# Patient Record
Sex: Male | Born: 1970
Health system: Southern US, Community
[De-identification: ages and names within clinical notes are randomized; demographics above are authoritative.]

## PROBLEM LIST (undated history)

## (undated) DIAGNOSIS — F1910 Other psychoactive substance abuse, uncomplicated: Secondary | ICD-10-CM

## (undated) DIAGNOSIS — C801 Malignant (primary) neoplasm, unspecified: Secondary | ICD-10-CM

## (undated) DIAGNOSIS — I10 Essential (primary) hypertension: Secondary | ICD-10-CM

## (undated) DIAGNOSIS — E669 Obesity, unspecified: Secondary | ICD-10-CM

## (undated) HISTORY — PX: OTHER SURGICAL HISTORY: SHX169

---

## 2009-05-06 ENCOUNTER — Emergency Department (HOSPITAL_COMMUNITY): Admission: EM | Admit: 2009-05-06 | Discharge: 2009-05-06 | Payer: Self-pay | Admitting: Emergency Medicine

## 2009-05-17 ENCOUNTER — Emergency Department (HOSPITAL_COMMUNITY): Admission: EM | Admit: 2009-05-17 | Discharge: 2009-05-17 | Payer: Self-pay | Admitting: Emergency Medicine

## 2010-09-20 ENCOUNTER — Emergency Department (HOSPITAL_COMMUNITY)
Admission: EM | Admit: 2010-09-20 | Discharge: 2010-09-21 | Payer: Self-pay | Source: Home / Self Care | Admitting: Emergency Medicine

## 2011-01-21 ENCOUNTER — Emergency Department (HOSPITAL_COMMUNITY)
Admission: EM | Admit: 2011-01-21 | Discharge: 2011-01-21 | Disposition: A | Discharge status: Home / Self Care | Payer: Self-pay | Attending: Emergency Medicine | Admitting: Emergency Medicine

## 2011-01-21 DIAGNOSIS — L0231 Cutaneous abscess of buttock: Secondary | ICD-10-CM | POA: Insufficient documentation

## 2011-01-21 DIAGNOSIS — K089 Disorder of teeth and supporting structures, unspecified: Secondary | ICD-10-CM | POA: Insufficient documentation

## 2011-01-21 DIAGNOSIS — K029 Dental caries, unspecified: Secondary | ICD-10-CM | POA: Insufficient documentation

## 2011-10-13 ENCOUNTER — Encounter (HOSPITAL_COMMUNITY): Payer: Self-pay | Admitting: *Deleted

## 2011-10-13 ENCOUNTER — Emergency Department (HOSPITAL_COMMUNITY)
Admission: EM | Admit: 2011-10-13 | Discharge: 2011-10-13 | Disposition: A | Discharge status: Home / Self Care | Payer: Self-pay | Attending: Emergency Medicine | Admitting: Emergency Medicine

## 2011-10-13 DIAGNOSIS — F17200 Nicotine dependence, unspecified, uncomplicated: Secondary | ICD-10-CM | POA: Insufficient documentation

## 2011-10-13 DIAGNOSIS — K0889 Other specified disorders of teeth and supporting structures: Secondary | ICD-10-CM

## 2011-10-13 DIAGNOSIS — K089 Disorder of teeth and supporting structures, unspecified: Secondary | ICD-10-CM | POA: Insufficient documentation

## 2011-10-13 DIAGNOSIS — R220 Localized swelling, mass and lump, head: Secondary | ICD-10-CM | POA: Insufficient documentation

## 2011-10-13 MED ORDER — IBUPROFEN 800 MG PO TABS: 800.0000 mg | ORAL_TABLET | Freq: Three times a day (TID) | ORAL | Status: AC

## 2011-10-13 MED ORDER — PENICILLIN V POTASSIUM 500 MG PO TABS: 500.0000 mg | ORAL_TABLET | Freq: Four times a day (QID) | ORAL | Status: AC

## 2011-10-13 MED ORDER — IBUPROFEN 800 MG PO TABS
800.0000 mg | ORAL_TABLET | Freq: Once | ORAL | Status: AC
  Administered 2011-10-13: 800 mg via ORAL
  Filled 2011-10-13: qty 1

## 2011-10-13 NOTE — ED Provider Notes (Signed)
History     CSN: [id number]  Arrival date & time 10/13/11  1224   First MD Initiated Contact with Patient 10/13/11 1241      Chief Complaint  Patient presents with  . Dental Pain    (Consider location/radiation/quality/duration/timing/severity/associated sxs/prior treatment) HPI Comments: Patient reports that he has been having left upper dental pain for several days.  He reports that he has a history of dental abscesses.  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  He denies any abscess at this time. Denies any fever or chills.   Patient is a 41 y.o. male presenting with tooth pain. The history is provided by the patient.  Dental PainPrimary symptoms do not include dental injury, oral bleeding, fever or shortness of breath. The symptoms are worsening.  Additional symptoms include: gum swelling and gum tenderness. Additional symptoms do not include: dental sensitivity to temperature, purulent gums, trismus, facial swelling, trouble swallowing, drooling, ear pain and swollen glands.    History reviewed. No pertinent past medical history.  Past Surgical History  Procedure Date  . Tendon repair l ring finger     History reviewed. No pertinent family history.  History  Substance Use Topics  . Smoking status: Current Everyday Smoker -- 0.5 packs/day    Types: Cigarettes  . Smokeless tobacco: Never Used  . Alcohol Use: No      Review of Systems  Constitutional: Negative for fever and chills.  HENT: Positive for dental problem. Negative for ear pain, facial swelling, drooling, trouble swallowing, neck pain and neck stiffness.   Respiratory: Negative for shortness of breath.   Cardiovascular: Negative for chest pain.  Gastrointestinal: Negative for nausea and vomiting.  Skin: Negative for color change.    Allergies  Review of patient's allergies indicates no known allergies.  Home Medications  No current outpatient  prescriptions on file.  BP 115/60  Pulse 89  Temp 98.7 F (37.1 C)  Resp 18  Wt 300 lb (136.079 kg)  SpO2 98%  Physical Exam  Nursing note and vitals reviewed. Constitutional: He is oriented to person, place, and time. He appears well-developed and well-nourished. No distress.  HENT:  Head: Normocephalic and atraumatic. No trismus in the jaw.  Mouth/Throat: Uvula is midline, oropharynx is clear and moist and mucous membranes are normal. Abnormal dentition. No dental abscesses or uvula swelling. No oropharyngeal exudate, posterior oropharyngeal edema, posterior oropharyngeal erythema or tonsillar abscesses.       Poor dental hygiene. Partial upper dentures.  Pt able to open and close mouth with out difficulty. Airway intact. Uvula midline. Mild gingival swelling with tenderness over affected area, but no fluctuance. No swelling or tenderness of submental and submandibular regions.  Eyes: Conjunctivae and EOM are normal.  Neck: Normal range of motion and full passive range of motion without pain. Neck supple.  Cardiovascular: Normal rate and regular rhythm.   Pulmonary/Chest: Effort normal and breath sounds normal. No stridor. No respiratory distress. He has no wheezes.  Musculoskeletal: Normal range of motion.  Lymphadenopathy:       Head (right side): No submental, no submandibular, no tonsillar, no preauricular and no posterior auricular adenopathy present.       Head (left side): No submental, no submandibular, no tonsillar, no preauricular and no posterior auricular adenopathy present.    He has no cervical adenopathy.  Neurological: He is alert and oriented to person, place, and time.  Skin: Skin is warm  and dry. No rash noted. He is not diaphoretic.    ED Course  Procedures (including critical care time)  Labs Reviewed - No data to display No results found.   1. Pain, dental       MDM  Patient with toothache.  No gross abscess.  Exam unconcerning for Ludwig's angina or  spread of infection.  Will treat with penicillin and pain medicine.  Urged patient to follow-up with dentist.          Pascal Lux [place], PA-C 10/13/11 [phone]

## 2011-10-13 NOTE — ED Notes (Signed)
Pt from home c/o "a bad tooth" on L top of mouth, area slightly swollen.

## 2011-10-15 NOTE — ED Provider Notes (Signed)
Medical screening examination/treatment/procedure(s) were performed by non-physician practitioner and as supervising physician I was immediately available for consultation/collaboration.   Forbes Cellar, MD 10/15/11 1320

## 2012-03-18 ENCOUNTER — Ambulatory Visit: Payer: Self-pay | Admitting: Internal Medicine

## 2012-03-18 VITALS — BP 121/84 | HR 92 | Temp 98.3°F | Resp 16 | Ht 73.0 in | Wt 301.6 lb

## 2012-03-18 DIAGNOSIS — K047 Periapical abscess without sinus: Secondary | ICD-10-CM

## 2012-03-18 DIAGNOSIS — K089 Disorder of teeth and supporting structures, unspecified: Secondary | ICD-10-CM

## 2012-03-18 MED ORDER — AMOXICILLIN 500 MG PO CAPS: 500.0000 mg | ORAL_CAPSULE | Freq: Two times a day (BID) | ORAL | Status: AC

## 2012-03-20 ENCOUNTER — Encounter: Payer: Self-pay | Admitting: Internal Medicine

## 2012-03-20 NOTE — Progress Notes (Signed)
  Subjective:    Patient ID: Carlos Duran, male    DOB: 07-06-1971, 41 y.o.   MRN: [id number]  HPIPain in left upper jaw With swelling around the mandible for several days Painful/no fever/history of poor dentition    Review of Systems     Objective:   Physical Exam Swollen at the gumline of the disease molar Tender induration in the left malar area Disease molar on the left upper       Assessment & Plan:  Problem #1 dental abscess-recurrent problem  Amoxicillin 1 g twice a day for one month Referred to the dental clinic [facility] He does not want narcotics for pain and will use NSAID

## 2012-10-27 ENCOUNTER — Encounter (HOSPITAL_COMMUNITY): Payer: Self-pay

## 2012-10-27 ENCOUNTER — Emergency Department (HOSPITAL_COMMUNITY)
Admission: EM | Admit: 2012-10-27 | Discharge: 2012-10-27 | Disposition: A | Discharge status: Home / Self Care | Payer: Medicaid Other | Attending: Emergency Medicine | Admitting: Emergency Medicine

## 2012-10-27 DIAGNOSIS — F17200 Nicotine dependence, unspecified, uncomplicated: Secondary | ICD-10-CM | POA: Insufficient documentation

## 2012-10-27 DIAGNOSIS — K089 Disorder of teeth and supporting structures, unspecified: Secondary | ICD-10-CM | POA: Insufficient documentation

## 2012-10-27 DIAGNOSIS — K029 Dental caries, unspecified: Secondary | ICD-10-CM | POA: Insufficient documentation

## 2012-10-27 DIAGNOSIS — K0889 Other specified disorders of teeth and supporting structures: Secondary | ICD-10-CM

## 2012-10-27 MED ORDER — PENICILLIN V POTASSIUM 500 MG PO TABS: 500.0000 mg | ORAL_TABLET | Freq: Four times a day (QID) | ORAL | Status: DC

## 2012-10-27 MED ORDER — HYDROCODONE-ACETAMINOPHEN 5-325 MG PO TABS: 1.0000 | ORAL_TABLET | Freq: Four times a day (QID) | ORAL | Status: DC | PRN

## 2012-10-27 MED ORDER — IBUPROFEN 800 MG PO TABS: 800.0000 mg | ORAL_TABLET | Freq: Three times a day (TID) | ORAL | Status: DC | PRN

## 2012-10-27 NOTE — ED Provider Notes (Signed)
Medical screening examination/treatment/procedure(s) were performed by non-physician practitioner and as supervising physician I was immediately available for consultation/collaboration. Devoria Albe, MD, Armando Gang   Ward Givens, MD 10/27/12 [phone]

## 2012-10-27 NOTE — ED Notes (Signed)
Pt complains of a toothache since last night, has dentist appt in two weeks

## 2012-10-27 NOTE — ED Provider Notes (Signed)
History   This chart was scribed for non-physician practitioner Ebbie Ridge, PA-C working with Ward Givens, MD by Gerlean Ren, ED Scribe. This patient was seen in room WTR9/WTR9 and the patient's care was started at 10:10 PM.    CSN: [id number]  Arrival date & time 10/27/12  2034   First MD Initiated Contact with Patient 10/27/12 2208      Chief Complaint  Patient presents with  . Dental Pain     The history is provided by the patient. No language interpreter was used.  Carlos Duran is a 42 y.o. male who presents to the Emergency Department complaining of upper right and upper left dental pain first noticed last night.  Pt denies fever, emesis, nausea, difficulty swallowing, dyspnea.  No chronic medical conditions.  No regular medications.  No known allergies.  Pt states he has a dental appointment in two weeks but that the pain caused pt to seek relief tonight.  History reviewed. No pertinent past medical history.  Past Surgical History  Procedure Date  . Tendon repair l ring finger     History reviewed. No pertinent family history.  History  Substance Use Topics  . Smoking status: Current Every Day Smoker -- 0.5 packs/day    Types: Cigarettes  . Smokeless tobacco: Never Used  . Alcohol Use: No      Review of Systems A complete 10 system review of systems was obtained and all systems are negative except as noted in the HPI and PMH.   Allergies  Review of patient's allergies indicates no known allergies.  Home Medications  No current outpatient prescriptions on file.  BP 137/78  Pulse 110  Temp 98.6 F (37 C) (Oral)  Resp 18  SpO2 96%  Physical Exam  Nursing note and vitals reviewed. Constitutional: He is oriented to person, place, and time. He appears well-developed and well-nourished. No distress.  HENT:  Head: Normocephalic and atraumatic.  Mouth/Throat: No uvula swelling.         Multiple areas of decay in right upper canine, left lateral incisor broken  off with small red swollen gumline, no swelling under tongue or neck.  Eyes: EOM are normal.  Neck: Neck supple. No tracheal deviation present.  Cardiovascular: Normal rate, regular rhythm and normal heart sounds.   Pulmonary/Chest: Effort normal and breath sounds normal. No respiratory distress. He has no wheezes.  Musculoskeletal: Normal range of motion.  Lymphadenopathy:    He has no cervical adenopathy.  Neurological: He is alert and oriented to person, place, and time.  Skin: Skin is warm and dry.  Psychiatric: He has a normal mood and affect. His behavior is normal.    ED Course  Procedures (including critical care time) DIAGNOSTIC STUDIES: Oxygen Saturation is 96% on room air, adequate by my interpretation.    COORDINATION OF CARE: 10:12 PM- Informed pt that we can treat his pain and provide anti-biotics.  Discussed with pt that we can provide contacts for dental care this evening that will work with him.  Pt understands and agrees with plan.     MDM  I personally performed the services described in this documentation, which was scribed in my presence. The recorded information has been reviewed and is accurate.      f  Carlyle Dolly, PA-C 10/27/12 2230

## 2013-01-14 ENCOUNTER — Encounter (HOSPITAL_COMMUNITY): Payer: Self-pay | Admitting: Emergency Medicine

## 2013-01-14 ENCOUNTER — Emergency Department (HOSPITAL_COMMUNITY): Payer: Medicaid Other

## 2013-01-14 ENCOUNTER — Emergency Department (HOSPITAL_COMMUNITY)
Admission: EM | Admit: 2013-01-14 | Discharge: 2013-01-14 | Disposition: A | Discharge status: Home / Self Care | Payer: Medicaid Other | Attending: Emergency Medicine | Admitting: Emergency Medicine

## 2013-01-14 DIAGNOSIS — B86 Scabies: Secondary | ICD-10-CM | POA: Insufficient documentation

## 2013-01-14 DIAGNOSIS — Z8781 Personal history of (healed) traumatic fracture: Secondary | ICD-10-CM | POA: Insufficient documentation

## 2013-01-14 DIAGNOSIS — M79609 Pain in unspecified limb: Secondary | ICD-10-CM | POA: Insufficient documentation

## 2013-01-14 DIAGNOSIS — M79645 Pain in left finger(s): Secondary | ICD-10-CM

## 2013-01-14 DIAGNOSIS — K089 Disorder of teeth and supporting structures, unspecified: Secondary | ICD-10-CM | POA: Insufficient documentation

## 2013-01-14 DIAGNOSIS — K0889 Other specified disorders of teeth and supporting structures: Secondary | ICD-10-CM

## 2013-01-14 DIAGNOSIS — F17200 Nicotine dependence, unspecified, uncomplicated: Secondary | ICD-10-CM | POA: Insufficient documentation

## 2013-01-14 MED ORDER — HYDROCODONE-ACETAMINOPHEN 5-325 MG PO TABS: 1.0000 | ORAL_TABLET | Freq: Four times a day (QID) | ORAL | Status: DC | PRN

## 2013-01-14 MED ORDER — PERMETHRIN 5 % EX CREA: TOPICAL_CREAM | CUTANEOUS | Status: DC

## 2013-01-14 MED ORDER — IBUPROFEN 600 MG PO TABS: 600.0000 mg | ORAL_TABLET | Freq: Four times a day (QID) | ORAL | Status: DC | PRN

## 2013-01-14 NOTE — ED Notes (Signed)
Pt reports 4 month hx of red, raised , itching bumps on whole boy x 4 months. Treated yesterday at Urgent Care for infection in l/hand- 4th finger, assessed for the rash-did not use cream provided

## 2013-01-14 NOTE — ED Provider Notes (Signed)
History     CSN: [id number]  Arrival date & time 01/14/13  1054   First MD Initiated Contact with Patient 01/14/13 1055      Chief Complaint  Patient presents with  . Rash    4 month hx of red, raised, itching rash over whole body    (Consider location/radiation/quality/duration/timing/severity/associated sxs/prior treatment) HPI Carlos Duran is a 42 y.o. male who presents to ED with multiple complaints. States has pain to the left ring finger. Hx of fracture with surgical repaire several years ago. States reinjured it several months ago now on and off swelling in the area where the pin used to be. Denies pain with movement. States painful with palpation. [facility]. Pt also reports rash that is over his axilla, groin, abdomen, hands and feet. States was told could be scabies, for unknown reason did not fill prescription given by [facility]. Pt stated " i didn't believe them" when asked why did not fill the prescription. Rash is itchy. Has not tried any medications. Rash for several weeks.  Finally pt reports left upper tooth ache and facial swelling that began several days ago. No fever, chills, malaise. Pt wears partials over decayed upper teeth.  History reviewed. No pertinent past medical history.  Past Surgical History  Procedure Laterality Date  . Tendon repair l ring finger      Family History  Problem Relation Age of Onset  . Hypertension Sister     History  Substance Use Topics  . Smoking status: Current Every Day Smoker -- 0.50 packs/day    Types: Cigarettes  . Smokeless tobacco: Never Used  . Alcohol Use: Yes      Review of Systems  Constitutional: Negative for fever and chills.  HENT: Positive for facial swelling and dental problem. Negative for neck pain and neck stiffness.   Respiratory: Negative.   Cardiovascular: Negative.   Musculoskeletal: Positive for joint swelling and arthralgias.  Skin: Positive for rash.  Neurological:  Negative for headaches.    Allergies  Review of patient's allergies indicates no known allergies.  Home Medications   Current Outpatient Rx  Name  Route  Sig  Dispense  Refill  . sulfamethoxazole-trimethoprim (BACTRIM DS,SEPTRA DS) 800-160 MG per tablet   Oral   Take 1 tablet by mouth 2 (two) times daily.           BP 139/89  Pulse 100  Temp(Src) 98.2 F (36.8 C) (Oral)  SpO2 98%  Physical Exam  Nursing note and vitals reviewed. Constitutional: He is oriented to person, place, and time. He appears well-developed and well-nourished. No distress.  HENT:  Head: Normocephalic and atraumatic.  Right Ear: External ear normal.  Left Ear: External ear normal.  Nose: Nose normal.  Mouth/Throat: Oropharynx is clear and moist.  Poor dentition, dental decay. No swelling of the gums noted. Tender to palpation to decayed left upper lateral incisor.   Eyes: Conjunctivae are normal.  Neck: Neck supple.  Cardiovascular: Normal rate, regular rhythm and normal heart sounds.   Pulmonary/Chest: Effort normal and breath sounds normal. No respiratory distress. He has no wheezes. He has no rales.  Musculoskeletal: He exhibits no edema.  Small scab to the dorsal middle phalanx of left ring finger with surrounding swelling and erythema. Tender to palpation. Nor joint swelling. Full rom of the finger at all joints.   Neurological: He is alert and oriented to person, place, and time.  Skin: Skin is warm and dry.  Papular scabby rash to the hands, feet, ankles, groins, lower abdomen, axilla. No surrounding erythema or cellulitic changes.     ED Course  Procedures (including critical care time)  Labs Reviewed - No data to display No results found.   1. Pain of finger of left hand   2. Scabies   3. Pain, dental       MDM  Pt with prior fusion of the left DIP joint of the ring finger here with possible infection vs loose screw per x-ray.  Discussed with Dr. Mina Marble who has reviewed the  xray, suggested to continue antibiotics, follow up with him in the office closely  Rash- most likely scabies. Will treat with permethrin.   Dental pain, will treat with pain medications and follow up with dentist.           Lottie Mussel, PA-C 01/14/13 1524

## 2013-01-15 NOTE — ED Provider Notes (Signed)
Medical screening examination/treatment/procedure(s) were performed by non-physician practitioner and as supervising physician I was immediately available for consultation/collaboration.  Adiyah Lame, MD 01/15/13 0753 

## 2013-02-10 ENCOUNTER — Emergency Department (HOSPITAL_BASED_OUTPATIENT_CLINIC_OR_DEPARTMENT_OTHER)
Admission: EM | Admit: 2013-02-10 | Discharge: 2013-02-10 | Disposition: A | Discharge status: Home / Self Care | Payer: Medicaid Other | Attending: Emergency Medicine | Admitting: Emergency Medicine

## 2013-02-10 ENCOUNTER — Encounter (HOSPITAL_BASED_OUTPATIENT_CLINIC_OR_DEPARTMENT_OTHER): Payer: Self-pay | Admitting: *Deleted

## 2013-02-10 DIAGNOSIS — Z79899 Other long term (current) drug therapy: Secondary | ICD-10-CM | POA: Insufficient documentation

## 2013-02-10 DIAGNOSIS — K089 Disorder of teeth and supporting structures, unspecified: Secondary | ICD-10-CM | POA: Insufficient documentation

## 2013-02-10 DIAGNOSIS — B86 Scabies: Secondary | ICD-10-CM | POA: Insufficient documentation

## 2013-02-10 DIAGNOSIS — R51 Headache: Secondary | ICD-10-CM | POA: Insufficient documentation

## 2013-02-10 DIAGNOSIS — F17200 Nicotine dependence, unspecified, uncomplicated: Secondary | ICD-10-CM | POA: Insufficient documentation

## 2013-02-10 DIAGNOSIS — R21 Rash and other nonspecific skin eruption: Secondary | ICD-10-CM | POA: Insufficient documentation

## 2013-02-10 DIAGNOSIS — Z760 Encounter for issue of repeat prescription: Secondary | ICD-10-CM | POA: Insufficient documentation

## 2013-02-10 DIAGNOSIS — Z792 Long term (current) use of antibiotics: Secondary | ICD-10-CM | POA: Insufficient documentation

## 2013-02-10 DIAGNOSIS — L299 Pruritus, unspecified: Secondary | ICD-10-CM | POA: Insufficient documentation

## 2013-02-10 DIAGNOSIS — K0889 Other specified disorders of teeth and supporting structures: Secondary | ICD-10-CM

## 2013-02-10 DIAGNOSIS — K429 Umbilical hernia without obstruction or gangrene: Secondary | ICD-10-CM | POA: Insufficient documentation

## 2013-02-10 MED ORDER — PERMETHRIN 5 % EX CREA: TOPICAL_CREAM | CUTANEOUS | Status: DC

## 2013-02-10 MED ORDER — OXYCODONE-ACETAMINOPHEN 5-325 MG PO TABS: 1.0000 | ORAL_TABLET | Freq: Four times a day (QID) | ORAL | Status: DC | PRN

## 2013-02-10 MED ORDER — PENICILLIN V POTASSIUM 500 MG PO TABS: 500.0000 mg | ORAL_TABLET | Freq: Three times a day (TID) | ORAL | Status: DC

## 2013-02-10 NOTE — ED Notes (Signed)
Pt here with multiple c/o.  Pt states he was treated for scabies 3-4 weeks ago and continues itching.  Also states he has new bites in between his fingers.  Pt is taking an abx that is causing him to vomit.  Pt also c/o umbilical pain/hernia.  Pt c/o abscessed tooth.

## 2013-02-10 NOTE — ED Notes (Signed)
Abdominal pain. States he thinks his umbelical hernia ruptured. States he also has scabies. He wants to get medication for an abscessed tooth while he is here.

## 2013-02-10 NOTE — ED Provider Notes (Signed)
History     CSN: [id number]  Arrival date & time 02/10/13  1945   First MD Initiated Contact with Patient 02/10/13 2014      Chief Complaint  Patient presents with  . Abdominal Pain    (Consider location/radiation/quality/duration/timing/severity/associated sxs/prior treatment) HPI Pt presents with multiple complaints.  He is requesting a refill of the permethrin cream he was prescribed for scabies approx 1 month ago.  He states this did help with the rash, but now it has recurred.  Rash is itchy and on hands, arms, abdomen.  States his daughter had similar symptoms but hers resolved with the permethrin.  He also is concerned about a bulging area near his belly button.  He states when he eats too much he feels the area stretching and sometimes sore.  No vomiting, no overlying redness or continuous pain.  Also c/o pain in left upper face and gum where a tooth broke off approx 1 month ago.  He was treated with abx which helped somewhat but now pain is recurring.  States he was told to f/u with the dentist but he has not made an appointment.  No difficulty breathing or swallowing.  There are no other associated systemic symptoms, there are no other alleviating or modifying factors.   History reviewed. No pertinent past medical history.  Past Surgical History  Procedure Laterality Date  . Tendon repair l ring finger      Family History  Problem Relation Age of Onset  . Hypertension Sister     History  Substance Use Topics  . Smoking status: Current Every Day Smoker -- 0.50 packs/day    Types: Cigarettes  . Smokeless tobacco: Never Used  . Alcohol Use: Yes      Review of Systems ROS reviewed and all otherwise negative except for mentioned in HPI  Allergies  Review of patient's allergies indicates no known allergies.  Home Medications   Current Outpatient Rx  Name  Route  Sig  Dispense  Refill  . HYDROcodone-acetaminophen (NORCO) 5-325 MG per tablet   Oral   Take 1  tablet by mouth every 6 (six) hours as needed for pain.   15 tablet   0   . ibuprofen (ADVIL,MOTRIN) 600 MG tablet   Oral   Take 1 tablet (600 mg total) by mouth every 6 (six) hours as needed for pain.   30 tablet   0   . oxyCODONE-acetaminophen (PERCOCET/ROXICET) 5-325 MG per tablet   Oral   Take 1-2 tablets by mouth every 6 (six) hours as needed for pain.   15 tablet   0   . penicillin v potassium (VEETID) 500 MG tablet   Oral   Take 1 tablet (500 mg total) by mouth 3 (three) times daily.   30 tablet   0   . permethrin (ELIMITE) 5 % cream      Apply to entire body neck down, wash off after 8 hrs. Repeat in 1 week.   60 g   0   . permethrin (ELIMITE) 5 % cream      Apply to affected area once   60 g   0   . sulfamethoxazole-trimethoprim (BACTRIM DS,SEPTRA DS) 800-160 MG per tablet   Oral   Take 1 tablet by mouth 2 (two) times daily.           BP 160/85  Pulse 108  Temp(Src) 99.2 F (37.3 C) (Oral)  Resp 18  Wt 300 lb (136.079 kg)  BMI 39.59  kg/m2  SpO2 95% Vitals reviewed Physical Exam Physical Examination: General appearance - alert, well appearing, and in no distress Mental status - alert, oriented to person, place, and time Eyes - no scleral icterus, no conjunctival injection Mouth - mucous membranes moist, pharynx normal without lesions, tooth broken off below the gumline in left upper gum region, no visible or palpable area of abscess, no swelling under tongue Chest - clear to auscultation, no wheezes, rales or rhonchi, symmetric air entry Heart - normal rate, regular rhythm, normal S1, S2, no murmurs, rubs, clicks or gallops Abdomen - soft, nontender, nondistended, no masses or organomegaly, approx 3cm area of umbilical hernia, easily reducible, nontender, no erythema or fluctuance overlying Skin - normal coloration and turgor, scattered erythematous/flesh colored papules over abdomen, arms and fingers of both hands  ED Course  Procedures  (including critical care time)  Labs Reviewed - No data to display No results found.   1. Scabies   2. Umbilical hernia   3. Pain, dental       MDM  Pt presenting with multiple complaints.  No evidence of incarcerated umbilical hernia- but hernia is present, so given information for outpatient follow up with surgery, rash appears c/w scabies, given rx for permethrin.  Also broken tooth treated with abx and given pain medications. Strongly encouraged to arrange for dental followup as well as an appointment with his primary care doctor. He states he has never seen his PMD- we had a detailed discussion about the role of primary care doctors and how it would be important for him to arrange for an outpatient appointment with his PMD.  Discharged with strict return precautions.  Pt agreeable with plan.        Ethelda Chick, MD 02/10/13 2114

## 2014-01-16 ENCOUNTER — Emergency Department (HOSPITAL_BASED_OUTPATIENT_CLINIC_OR_DEPARTMENT_OTHER): Payer: Self-pay

## 2014-01-16 ENCOUNTER — Encounter (HOSPITAL_BASED_OUTPATIENT_CLINIC_OR_DEPARTMENT_OTHER): Payer: Self-pay | Admitting: Emergency Medicine

## 2014-01-16 ENCOUNTER — Emergency Department (HOSPITAL_BASED_OUTPATIENT_CLINIC_OR_DEPARTMENT_OTHER)
Admission: EM | Admit: 2014-01-16 | Discharge: 2014-01-17 | Disposition: A | Discharge status: Home / Self Care | Payer: Self-pay | Attending: Emergency Medicine | Admitting: Emergency Medicine

## 2014-01-16 DIAGNOSIS — Z792 Long term (current) use of antibiotics: Secondary | ICD-10-CM | POA: Insufficient documentation

## 2014-01-16 DIAGNOSIS — F17200 Nicotine dependence, unspecified, uncomplicated: Secondary | ICD-10-CM | POA: Insufficient documentation

## 2014-01-16 DIAGNOSIS — R071 Chest pain on breathing: Secondary | ICD-10-CM | POA: Insufficient documentation

## 2014-01-16 DIAGNOSIS — R0789 Other chest pain: Secondary | ICD-10-CM

## 2014-01-16 LAB — CBC WITH DIFFERENTIAL/PLATELET
BASOS PCT: 0 % (ref 0–1)
Basophils Absolute: 0 10*3/uL (ref 0.0–0.1)
EOS ABS: 0.2 10*3/uL (ref 0.0–0.7)
EOS PCT: 2 % (ref 0–5)
HEMATOCRIT: 38.5 % — AB (ref 39.0–52.0)
HEMOGLOBIN: 12.5 g/dL — AB (ref 13.0–17.0)
Lymphocytes Relative: 31 % (ref 12–46)
Lymphs Abs: 3.5 10*3/uL (ref 0.7–4.0)
MCH: 30 pg (ref 26.0–34.0)
MCHC: 32.5 g/dL (ref 30.0–36.0)
MCV: 92.3 fL (ref 78.0–100.0)
MONO ABS: 0.9 10*3/uL (ref 0.1–1.0)
MONOS PCT: 8 % (ref 3–12)
NEUTROS PCT: 60 % (ref 43–77)
Neutro Abs: 6.9 10*3/uL (ref 1.7–7.7)
Platelets: 148 10*3/uL — ABNORMAL LOW (ref 150–400)
RBC: 4.17 MIL/uL — ABNORMAL LOW (ref 4.22–5.81)
RDW: 16 % — ABNORMAL HIGH (ref 11.5–15.5)
WBC: 11.5 10*3/uL — ABNORMAL HIGH (ref 4.0–10.5)

## 2014-01-16 LAB — D-DIMER, QUANTITATIVE: D-Dimer, Quant: 0.53 ug/mL-FEU — ABNORMAL HIGH (ref 0.00–0.48)

## 2014-01-16 MED ORDER — KETOROLAC TROMETHAMINE 30 MG/ML IJ SOLN
30.0000 mg | Freq: Once | INTRAMUSCULAR | Status: AC
  Administered 2014-01-16: 30 mg via INTRAVENOUS
  Filled 2014-01-16: qty 1

## 2014-01-16 NOTE — ED Provider Notes (Signed)
CSN: [id number]     Arrival date & time 01/16/14  2300 History  This chart was scribed for Carlos Duran Dekayla Prestridge-Rasch, MD by Elveria Risingimelie Horne, ED scribe.  This patient was seen in room MH04/MH04 and the patient's care was started at 11:14 PM.   Chief Complaint  Patient presents with  . Chest Pain     Patient is a 43 y.o. male presenting with chest pain. The history is provided by the patient. No language interpreter was used.  Chest Pain Pain location:  R chest Pain quality: sharp   Pain radiates to:  Does not radiate Pain radiates to the back: no   Pain severity:  Moderate Onset quality:  Gradual Duration:  3 weeks Timing:  Constant Progression:  Worsening Chronicity:  Recurrent Context: movement and at rest   Relieved by:  None tried Worsened by:  Nothing tried Ineffective treatments:  None tried Associated symptoms: no abdominal pain, no back pain, no cough, no diaphoresis, no fever, no lower extremity edema, no nausea, no orthopnea, no palpitations, no shortness of breath, no syncope and not vomiting   Risk factors: smoking    HPI Comments: Carlos Duran is a 43 y.o. male who presents to the Emergency Department complaining of sharp, stabbing right sided chest pain, ongoing for three weeks.Patient reports that his pain is worse when laying down on his right side. Today, however, his chest hurt when he was driving.  Patient has not attempted treatment with medication.   History reviewed. No pertinent past medical history. Past Surgical History  Procedure Laterality Date  . Tendon repair l ring finger     Family History  Problem Relation Age of Onset  . Hypertension Sister    History  Substance Use Topics  . Smoking status: Current Every Day Smoker -- 0.50 packs/day    Types: Cigarettes  . Smokeless tobacco: Never Used  . Alcohol Use: Yes    Review of Systems  Constitutional: Negative for fever and diaphoresis.  Respiratory: Negative for cough, chest tightness, shortness of  breath and wheezing.   Cardiovascular: Positive for chest pain. Negative for palpitations, orthopnea, leg swelling and syncope.  Gastrointestinal: Negative for nausea, vomiting and abdominal pain.  Musculoskeletal: Negative for back pain.  All other systems reviewed and are negative.     Allergies  Review of patient's allergies indicates no known allergies.  Home Medications   Prior to Admission medications   Medication Sig Start Date End Date Taking? Authorizing Provider  HYDROcodone-acetaminophen (NORCO) 5-325 MG per tablet Take 1 tablet by mouth every 6 (six) hours as needed for pain. 01/14/13   Tatyana A Kirichenko, PA-C  ibuprofen (ADVIL,MOTRIN) 600 MG tablet Take 1 tablet (600 mg total) by mouth every 6 (six) hours as needed for pain. 01/14/13   Tatyana A Kirichenko, PA-C  oxyCODONE-acetaminophen (PERCOCET/ROXICET) 5-325 MG per tablet Take 1-2 tablets by mouth every 6 (six) hours as needed for pain. 02/10/13   Ethelda ChickMartha K Linker, MD  penicillin v potassium (VEETID) 500 MG tablet Take 1 tablet (500 mg total) by mouth 3 (three) times daily. 02/10/13   Ethelda ChickMartha K Linker, MD  permethrin (ELIMITE) 5 % cream Apply to entire body neck down, wash off after 8 hrs. Repeat in 1 week. 01/14/13   Tatyana A Kirichenko, PA-C  permethrin (ELIMITE) 5 % cream Apply to affected area once 02/10/13   Ethelda ChickMartha K Linker, MD  sulfamethoxazole-trimethoprim (BACTRIM DS,SEPTRA DS) 800-160 MG per tablet Take 1 tablet by mouth 2 (two) times daily.  Historical Provider, MD   Triage Vitals: BP 151/82  Pulse 100  Temp(Src) 98.8 F (37.1 C) (Oral)  Resp 20  Ht 6\' 1"  (1.854 m)  Wt 320 lb (145.151 kg)  BMI 42.23 kg/m2  SpO2 99% Physical Exam  Nursing note and vitals reviewed. Constitutional: He is oriented to person, place, and time. He appears well-developed and well-nourished. No distress.  HENT:  Head: Normocephalic and atraumatic.  Mouth/Throat: No oropharyngeal exudate.  Eyes: EOM are normal. Pupils are equal,  round, and reactive to light.  Neck: Normal range of motion. Neck supple. No tracheal deviation present.  Cardiovascular: Normal rate and regular rhythm.  Exam reveals no gallop and no friction rub.   No murmur heard. Pulmonary/Chest: Effort normal and breath sounds normal. No respiratory distress. He has no wheezes. He has no rales.   He exhibits tenderness.  Right axillary reproducible.  Abdominal: Soft. Bowel sounds are normal. There is no tenderness. There is no rebound and no guarding.  Musculoskeletal: Normal range of motion.  Neurological: He is alert and oriented to person, place, and time. He has normal reflexes.  Skin: Skin is warm and dry. He is not diaphoretic.  Psychiatric: He has a normal mood and affect. His behavior is normal.    ED Course  Procedures (including critical care time) DIAGNOSTIC STUDIES: Oxygen Saturation is 99% on room air, normal by my interpretation.    COORDINATION OF CARE: 11:18 PM- Discussed treatment plan with patient at bedside and patient agreed to plan.     Labs Review Labs Reviewed  CBC WITH DIFFERENTIAL  BASIC METABOLIC PANEL  TROPONIN I  D-DIMER, QUANTITATIVE    Imaging Review No results found.   EKG Interpretation None      Date: 01/17/2014  Rate: 100  Rhythm: normal sinus rhythm  QRS Axis: normal  Intervals: normal  ST/T Wave abnormalities: normal  Conduction Disutrbances: none  Narrative Interpretation: unremarkable     MDM   Final diagnoses:  None    In the setting of > 8 hours of constant CP with normal EKG and troponin ACS is excluded.  Highly doubt cardiac etiology.  There is no PE or dissection and with reproducible nature of the pain it is chest wall pain.  NSAIDs and heat and follow up with your family doctor for ongoing care.    I personally performed the services described in this documentation, which was scribed in my presence. The recorded information has been reviewed and is accurate.    Jasmine AweApril K  Lakendrick Paradis-Rasch, MD 01/17/14 [phone]

## 2014-01-16 NOTE — ED Notes (Signed)
Chest pain in his right chest when he lays on his right side x 3 weeks. Was driving today and the pain was there. Sharp stabbing.

## 2014-01-17 ENCOUNTER — Emergency Department (HOSPITAL_BASED_OUTPATIENT_CLINIC_OR_DEPARTMENT_OTHER): Payer: Medicaid Other

## 2014-01-17 ENCOUNTER — Encounter (HOSPITAL_BASED_OUTPATIENT_CLINIC_OR_DEPARTMENT_OTHER): Payer: Self-pay | Admitting: Emergency Medicine

## 2014-01-17 LAB — BASIC METABOLIC PANEL
BUN: 13 mg/dL (ref 6–23)
CALCIUM: 9.6 mg/dL (ref 8.4–10.5)
CO2: 25 mEq/L (ref 19–32)
CREATININE: 0.8 mg/dL (ref 0.50–1.35)
Chloride: 101 mEq/L (ref 96–112)
Glucose, Bld: 126 mg/dL — ABNORMAL HIGH (ref 70–99)
Potassium: 3.7 mEq/L (ref 3.7–5.3)
Sodium: 140 mEq/L (ref 137–147)

## 2014-01-17 LAB — TROPONIN I: Troponin I: <0.3 ng/mL (ref <0.30)

## 2014-01-17 MED ORDER — NAPROXEN 375 MG PO TABS: 375.0000 mg | ORAL_TABLET | Freq: Two times a day (BID) | ORAL | Status: DC

## 2014-01-17 MED ORDER — IOHEXOL 350 MG/ML SOLN
80.0000 mL | Freq: Once | INTRAVENOUS | Status: AC | PRN
  Administered 2014-01-17: 80 mL via INTRAVENOUS

## 2014-01-17 NOTE — ED Notes (Signed)
Patient transported to CT 

## 2014-01-17 NOTE — Discharge Instructions (Signed)

## 2014-01-17 NOTE — ED Provider Notes (Addendum)
Patient at discharge wants to be seen for an old left ring finger injury, there is a scab over the dorsal DIP, no warmth or erythema, finger is contracted.  No kanavels signs.  Bacitracin applied.  Patient per notes was supposed to have seen Dr. Mina MarbleWeingold a year ago.  He will need to follow up with hand surgery for ongoing care.    Carlos AweApril K Demetra Moya-Rasch, MD 01/17/14 0156  Carlos Lamping K Edon Hoadley-Rasch, MD 01/17/14 [phone]

## 2014-11-22 ENCOUNTER — Emergency Department (HOSPITAL_BASED_OUTPATIENT_CLINIC_OR_DEPARTMENT_OTHER)
Admission: EM | Admit: 2014-11-22 | Discharge: 2014-11-22 | Disposition: A | Discharge status: Home / Self Care | Payer: Medicaid Other | Attending: Emergency Medicine | Admitting: Emergency Medicine

## 2014-11-22 ENCOUNTER — Encounter (HOSPITAL_BASED_OUTPATIENT_CLINIC_OR_DEPARTMENT_OTHER): Payer: Self-pay | Admitting: *Deleted

## 2014-11-22 DIAGNOSIS — Z791 Long term (current) use of non-steroidal anti-inflammatories (NSAID): Secondary | ICD-10-CM | POA: Insufficient documentation

## 2014-11-22 DIAGNOSIS — Z202 Contact with and (suspected) exposure to infections with a predominantly sexual mode of transmission: Secondary | ICD-10-CM | POA: Diagnosis present

## 2014-11-22 DIAGNOSIS — Z79899 Other long term (current) drug therapy: Secondary | ICD-10-CM | POA: Insufficient documentation

## 2014-11-22 DIAGNOSIS — Z792 Long term (current) use of antibiotics: Secondary | ICD-10-CM | POA: Diagnosis not present

## 2014-11-22 DIAGNOSIS — Z711 Person with feared health complaint in whom no diagnosis is made: Secondary | ICD-10-CM

## 2014-11-22 DIAGNOSIS — Z720 Tobacco use: Secondary | ICD-10-CM | POA: Diagnosis not present

## 2014-11-22 DIAGNOSIS — I10 Essential (primary) hypertension: Secondary | ICD-10-CM | POA: Diagnosis not present

## 2014-11-22 LAB — GC/CHLAMYDIA PROBE AMP (~~LOC~~) NOT AT ARMC
CHLAMYDIA, DNA PROBE: NEGATIVE
Neisseria Gonorrhea: NEGATIVE

## 2014-11-22 NOTE — ED Provider Notes (Signed)
CSN: [id number]     Arrival date & time 11/22/14  0056 History   First MD Initiated Contact with Patient 11/22/14 0145     Chief Complaint  Patient presents with  . Exposure      (Consider location/radiation/quality/duration/timing/severity/associated sxs/prior Treatment) HPI This is a 44 year old male who states his wife was diagnosed with bacterial vaginosis by her doctor. She was treated with Flagyl. She told him that her doctor said that he needed to be treated with antibiotics for this as well. He denies symptoms, specifically dysuria or penile discharge. He states they are monogamous.  History reviewed. No pertinent past medical history. Past Surgical History  Procedure Laterality Date  . Tendon repair l ring finger     Family History  Problem Relation Age of Onset  . Hypertension Sister    History  Substance Use Topics  . Smoking status: Current Every Day Smoker -- 0.50 packs/day    Types: Cigarettes  . Smokeless tobacco: Never Used  . Alcohol Use: Yes    Review of Systems  All other systems reviewed and are negative.   Allergies  Review of patient's allergies indicates no known allergies.  Home Medications   Prior to Admission medications   Medication Sig Start Date End Date Taking? Authorizing Provider  HYDROcodone-acetaminophen (NORCO) 5-325 MG per tablet Take 1 tablet by mouth every 6 (six) hours as needed for pain. 01/14/13   Tatyana A Kirichenko, PA-C  ibuprofen (ADVIL,MOTRIN) 600 MG tablet Take 1 tablet (600 mg total) by mouth every 6 (six) hours as needed for pain. 01/14/13   Tatyana A Kirichenko, PA-C  naproxen (NAPROSYN) 375 MG tablet Take 1 tablet (375 mg total) by mouth 2 (two) times daily. 01/17/14   April K Palumbo-Rasch, MD  oxyCODONE-acetaminophen (PERCOCET/ROXICET) 5-325 MG per tablet Take 1-2 tablets by mouth every 6 (six) hours as needed for pain. 02/10/13   Ethelda ChickMartha K Linker, MD  penicillin v potassium (VEETID) 500 MG tablet Take 1 tablet (500 mg  total) by mouth 3 (three) times daily. 02/10/13   Ethelda ChickMartha K Linker, MD  permethrin (ELIMITE) 5 % cream Apply to entire body neck down, wash off after 8 hrs. Repeat in 1 week. 01/14/13   Tatyana A Kirichenko, PA-C  permethrin (ELIMITE) 5 % cream Apply to affected area once 02/10/13   Ethelda ChickMartha K Linker, MD  sulfamethoxazole-trimethoprim (BACTRIM DS,SEPTRA DS) 800-160 MG per tablet Take 1 tablet by mouth 2 (two) times daily.    Historical Provider, MD   BP 123/71 mmHg  Pulse 98  Temp(Src) 98.6 F (37 C) (Oral)  Resp 16  Ht 6' (1.829 m)  Wt 300 lb (136.079 kg)  BMI 40.68 kg/m2  SpO2 99%   Physical Exam  General: Well-developed, well-nourished male in no acute distress; appearance consistent with age of record HENT: normocephalic; atraumatic Eyes: Normal appearance Neck: supple Heart: Regular rate and rhythm Lungs: Normal respiratory effort and excursion Abdomen: soft; nondistended GU: Tanner 5 male, circumcised; no urethral discharge Extremities: No deformity; full range of motion Neurologic: Awake, alert and oriented; motor function intact in all extremities and symmetric; no facial droop Skin: Warm and dry Psychiatric: Normal mood and affect    ED Course  Procedures (including critical care time)    MDM  Patient advised that treatment of male sexual partners of women with bacterial vaginosis is not indicated. We will test for GC and Chlamydia.    Hanley SeamenJohn L Deanette Tullius, MD 11/22/14 [phone]

## 2014-11-22 NOTE — ED Notes (Signed)
Pt reports that his wife has BV and he is requesting to be rx an antibiotic.

## 2014-11-22 NOTE — ED Notes (Signed)
States his wife is being treated for bv  And her md said he needed to be treated  Pt states he has tingling w urination x 2 days  Denies dc

## 2015-09-29 ENCOUNTER — Emergency Department (HOSPITAL_BASED_OUTPATIENT_CLINIC_OR_DEPARTMENT_OTHER)
Admission: EM | Admit: 2015-09-29 | Discharge: 2015-09-29 | Disposition: A | Discharge status: Home / Self Care | Payer: Medicaid Other | Attending: Emergency Medicine | Admitting: Emergency Medicine

## 2015-09-29 ENCOUNTER — Encounter (HOSPITAL_BASED_OUTPATIENT_CLINIC_OR_DEPARTMENT_OTHER): Payer: Self-pay | Admitting: *Deleted

## 2015-09-29 DIAGNOSIS — Z791 Long term (current) use of non-steroidal anti-inflammatories (NSAID): Secondary | ICD-10-CM | POA: Insufficient documentation

## 2015-09-29 DIAGNOSIS — K002 Abnormalities of size and form of teeth: Secondary | ICD-10-CM | POA: Insufficient documentation

## 2015-09-29 DIAGNOSIS — F17210 Nicotine dependence, cigarettes, uncomplicated: Secondary | ICD-10-CM | POA: Insufficient documentation

## 2015-09-29 DIAGNOSIS — K029 Dental caries, unspecified: Secondary | ICD-10-CM

## 2015-09-29 DIAGNOSIS — Z792 Long term (current) use of antibiotics: Secondary | ICD-10-CM | POA: Insufficient documentation

## 2015-09-29 DIAGNOSIS — K0889 Other specified disorders of teeth and supporting structures: Secondary | ICD-10-CM | POA: Insufficient documentation

## 2015-09-29 MED ORDER — AMOXICILLIN 500 MG PO CAPS
500.0000 mg | ORAL_CAPSULE | Freq: Once | ORAL | Status: AC
  Administered 2015-09-29: 500 mg via ORAL
  Filled 2015-09-29: qty 1

## 2015-09-29 MED ORDER — AMOXICILLIN 500 MG PO CAPS: 500.0000 mg | ORAL_CAPSULE | Freq: Three times a day (TID) | ORAL | Status: DC

## 2015-09-29 MED ORDER — HYDROCODONE-ACETAMINOPHEN 5-325 MG PO TABS
2.0000 | ORAL_TABLET | Freq: Once | ORAL | Status: DC
  Filled 2015-09-29: qty 2

## 2015-09-29 MED ORDER — HYDROCODONE-ACETAMINOPHEN 5-325 MG PO TABS: ORAL_TABLET | ORAL | Status: DC

## 2015-09-29 NOTE — ED Notes (Signed)
Dental pain x 2 days

## 2015-09-29 NOTE — ED Provider Notes (Signed)
CSN: [id number]     Arrival date & time 09/29/15  1948 History   First MD Initiated Contact with Patient 09/29/15 2020     Chief Complaint  Patient presents with  . Dental Pain     (Consider location/radiation/quality/duration/timing/severity/associated sxs/prior Treatment) HPI   Blood pressure 149/92, pulse 86, temperature 98.3 F (36.8 C), temperature source Oral, resp. rate 18, height 6' (1.829 m), weight 136.079 kg, SpO2 100 %.  Jerrid Forgette is a 45 y.o. male complaining of left lower dental pain worsening over the course of 5 days. Patient is been taking ibuprofen at home with little relief. Denies fever/chills, difficulty opening jaw, difficulty swallowing, SOB, gum swelling, facial swelling, neck swelling.   History reviewed. No pertinent past medical history. Past Surgical History  Procedure Laterality Date  . Tendon repair l ring finger     Family History  Problem Relation Age of Onset  . Hypertension Sister    Social History  Substance Use Topics  . Smoking status: Current Every Day Smoker -- 0.50 packs/day    Types: Cigarettes  . Smokeless tobacco: Never Used  . Alcohol Use: Yes    Review of Systems  10 systems reviewed and found to be negative, except as noted in the HPI.   Allergies  Review of patient's allergies indicates no known allergies.  Home Medications   Prior to Admission medications   Medication Sig Start Date End Date Taking? Authorizing Provider  amoxicillin (AMOXIL) 500 MG capsule Take 1 capsule (500 mg total) by mouth 3 (three) times daily. 09/29/15   Tyner Codner, PA-C  HYDROcodone-acetaminophen (NORCO/VICODIN) 5-325 MG tablet Take 1-2 tablets by mouth every 6 hours as needed for pain and/or cough. 09/29/15   Hubert Derstine, PA-C  ibuprofen (ADVIL,MOTRIN) 600 MG tablet Take 1 tablet (600 mg total) by mouth every 6 (six) hours as needed for pain. 01/14/13   Tatyana Kirichenko, PA-C  naproxen (NAPROSYN) 375 MG tablet Take 1 tablet (375 mg  total) by mouth 2 (two) times daily. 01/17/14   April Palumbo, MD  oxyCODONE-acetaminophen (PERCOCET/ROXICET) 5-325 MG per tablet Take 1-2 tablets by mouth every 6 (six) hours as needed for pain. 02/10/13   Jerelyn Scott, MD  penicillin v potassium (VEETID) 500 MG tablet Take 1 tablet (500 mg total) by mouth 3 (three) times daily. 02/10/13   Jerelyn Scott, MD  permethrin (ELIMITE) 5 % cream Apply to entire body neck down, wash off after 8 hrs. Repeat in 1 week. 01/14/13   Tatyana Kirichenko, PA-C  permethrin (ELIMITE) 5 % cream Apply to affected area once 02/10/13   Jerelyn Scott, MD  sulfamethoxazole-trimethoprim (BACTRIM DS,SEPTRA DS) 800-160 MG per tablet Take 1 tablet by mouth 2 (two) times daily.    Historical Provider, MD   BP 149/92 mmHg  Pulse 86  Temp(Src) 98.3 F (36.8 C) (Oral)  Resp 18  Ht 6' (1.829 m)  Wt 136.079 kg  BMI 40.68 kg/m2  SpO2 100% Physical Exam  Constitutional: He is oriented to person, place, and time. He appears well-developed and well-nourished. No distress.  HENT:  Head: Normocephalic.  Mouth/Throat: Oropharynx is clear and moist.  Generally poor dentition, no gingival swelling, erythema or tenderness to palpation. Patient is handling their secretions. There is no tenderness to palpation or firmness underneath tongue bilaterally. No trismus.    Eyes: Conjunctivae and EOM are normal. Pupils are equal, round, and reactive to light.  Neck: Normal range of motion.  Cardiovascular: Normal rate.   Pulmonary/Chest: Effort normal. No stridor.  Abdominal: Soft.  Musculoskeletal: Normal range of motion.  Lymphadenopathy:    He has no cervical adenopathy.  Neurological: He is alert and oriented to person, place, and time.  Psychiatric: He has a normal mood and affect.  Nursing note and vitals reviewed.   ED Course  Procedures (including critical care time) Labs Review Labs Reviewed - No data to display  Imaging Review No results found. I have personally  reviewed and evaluated these images and lab results as part of my medical decision-making.   EKG Interpretation None      MDM   Final diagnoses:  Pain due to dental caries    Filed Vitals:   09/29/15 1952 09/29/15 2134  BP: 104/63 149/92  Pulse: 94 86  Temp: 98.3 F (36.8 C)   TempSrc: Oral   Resp: 18 18  Height: 6' (1.829 m)   Weight: 136.079 kg   SpO2: 99% 100%    Medications  HYDROcodone-acetaminophen (NORCO/VICODIN) 5-325 MG per tablet 2 tablet (2 tablets Oral Not Given 09/29/15 2129)  amoxicillin (AMOXIL) capsule 500 mg (500 mg Oral Given 09/29/15 2129)    Roxy CedarRicardo Yale is 45 y.o. male presenting with dental pain associated with dental caries but no signs or symptoms of dental abscess. Patient afebrile, non toxic appearing and swallowing secretions well. I gave patient referral to dentist and stressed the importance of dental follow up for definitive management of dental issues. Patient voices understanding and is agreeable to plan.  Evaluation does not show pathology that would require ongoing emergent intervention or inpatient treatment. Pt is hemodynamically stable and mentating appropriately. Discussed findings and plan with patient/guardian, who agrees with care plan. All questions answered. Return precautions discussed and outpatient follow up given.   Discharge Medication List as of 09/29/2015  9:16 PM    START taking these medications   Details  amoxicillin (AMOXIL) 500 MG capsule Take 1 capsule (500 mg total) by mouth 3 (three) times daily., Starting 09/29/2015, Until Discontinued, Print             [employer] Paiden Cavell, PA-C 09/29/15 2213  Melene Planan Floyd, DO 09/29/15 2313

## 2015-09-29 NOTE — Discharge Instructions (Signed)
Take percocet for breakthrough pain, do not drink alcohol, drive, care for children or do other critical tasks while taking percocet.  Return to the emergency room for fever, change in vision, redness to the face that rapidly spreads towards the eye, nausea or vomiting, difficulty swallowing or shortness of breath.   Apply warm compresses to jaw throughout the day.   Take your antibiotics as directed and to the end of the course.  Followup with a dentist is very important for ongoing evaluation and management of recurrent dental pain. Return to emergency department for emergent changing or worsening symptoms.  Low-cost dental clinic: [facility]**David  Civils  at [phone]**   You may also call [phone]   Dental Caries Dental caries (also called tooth decay) is the most common oral disease. It can occur at any age but is more common in children and young adults.  HOW DENTAL CARIES DEVELOPS  The process of decay begins when bacteria and foods (particularly sugars and starches) combine in your mouth to produce plaque. Plaque is a substance that sticks to the hard, outer surface of a tooth (enamel). The bacteria in plaque produce acids that attack enamel. These acids may also attack the root surface of a tooth (cementum) if it is exposed. Repeated attacks dissolve these surfaces and create holes in the tooth (cavities). If left untreated, the acids destroy the other layers of the tooth.  RISK FACTORS  Frequent sipping of sugary beverages.   Frequent snacking on sugary and starchy foods, especially those that easily get stuck in the teeth.   Poor oral hygiene.   Dry mouth.   Substance abuse such as methamphetamine abuse.   Broken or poor-fitting dental restorations.   Eating disorders.   Gastroesophageal reflux disease (GERD).   Certain radiation treatments to the head and neck. SYMPTOMS In the early stages of dental caries, symptoms are seldom present. Sometimes white,  chalky areas may be seen on the enamel or other tooth layers. In later stages, symptoms may include:  Pits and holes on the enamel.  Toothache after sweet, hot, or cold foods or drinks are consumed.  Pain around the tooth.  Swelling around the tooth. DIAGNOSIS  Most of the time, dental caries is detected during a regular dental checkup. A diagnosis is made after a thorough medical and dental history is taken and the surfaces of your teeth are checked for signs of dental caries. Sometimes special instruments, such as lasers, are used to check for dental caries. Dental X-ray exams may be taken so that areas not visible to the eye (such as between the contact areas of the teeth) can be checked for cavities.  TREATMENT  If dental caries is in its early stages, it may be reversed with a fluoride treatment or an application of a remineralizing agent at the dental office. Thorough brushing and flossing at home is needed to aid these treatments. If it is in its later stages, treatment depends on the location and extent of tooth destruction:   If a small area of the tooth has been destroyed, the destroyed area will be removed and cavities will be filled with a material such as gold, silver amalgam, or composite resin.   If a large area of the tooth has been destroyed, the destroyed area will be removed and a cap (crown) will be fitted over the remaining tooth structure.   If the center part of the tooth (pulp) is affected, a procedure called a root canal will be needed before  a filling or crown can be placed.   If most of the tooth has been destroyed, the tooth may need to be pulled (extracted). HOME CARE INSTRUCTIONS You can prevent, stop, or reverse dental caries at home by practicing good oral hygiene. Good oral hygiene includes:  Thoroughly cleaning your teeth at least twice a day with a toothbrush and dental floss.   Using a fluoride toothpaste. A fluoride mouth rinse may also be used if  recommended by your dentist or health care provider.   Restricting the amount of sugary and starchy foods and sugary liquids you consume.   Avoiding frequent snacking on these foods and sipping of these liquids.   Keeping regular visits with a dentist for checkups and cleanings. PREVENTION   Practice good oral hygiene.  Consider a dental sealant. A dental sealant is a coating material that is applied by your dentist to the pits and grooves of teeth. The sealant prevents food from being trapped in them. It may protect the teeth for several years.  Ask about fluoride supplements if you live in a community without fluorinated water or with water that has a low fluoride content. Use fluoride supplements as directed by your dentist or health care provider.  Allow fluoride varnish applications to teeth if directed by your dentist or health care provider.   This information is not intended to replace advice given to you by your health care provider. Make sure you discuss any questions you have with your health care provider.   Document Released: 05/30/2002 Document Revised: 09/28/2014 Document Reviewed: 09/09/2012 Elsevier Interactive Patient Education 2016 [employer].   Dental Assistance If the dentist on-call cannot see you, please use the resources below:   Patients with Medicaid: [facility] [id number] [address], [phone] [phone]  If unable to pay, or uninsured, contact [facility] [phone]) or [facility] Department [phone] in [place], [phone] in [facility]) to become qualified for the adult dental clinic  Other Low-Cost Community Dental Services: [facility]- [address] Natasha Bence [place], [place], [address]    [phone], Ext. [phone]    2nd and 4th Thursday of the month at 6:30am    10 clients each day by appointment, can sometimes see walk-in     patients if someone does not show for an appointment [facility]- [address] Ether Griffins [place], [place], [address]    [phone] [facility] [address], [place], [place], [address]    [phone]  [facility] Department- [phone] [facility] Department- [phone] [facility] Department- [phone]

## 2015-11-05 ENCOUNTER — Encounter (HOSPITAL_BASED_OUTPATIENT_CLINIC_OR_DEPARTMENT_OTHER): Payer: Self-pay | Admitting: Emergency Medicine

## 2015-11-05 ENCOUNTER — Emergency Department (HOSPITAL_BASED_OUTPATIENT_CLINIC_OR_DEPARTMENT_OTHER)
Admission: EM | Admit: 2015-11-05 | Discharge: 2015-11-05 | Disposition: A | Discharge status: Home / Self Care | Payer: Medicaid Other | Attending: Emergency Medicine | Admitting: Emergency Medicine

## 2015-11-05 DIAGNOSIS — T85848A Pain due to other internal prosthetic devices, implants and grafts, initial encounter: Secondary | ICD-10-CM

## 2015-11-05 DIAGNOSIS — Z792 Long term (current) use of antibiotics: Secondary | ICD-10-CM | POA: Insufficient documentation

## 2015-11-05 DIAGNOSIS — T8484XA Pain due to internal orthopedic prosthetic devices, implants and grafts, initial encounter: Secondary | ICD-10-CM | POA: Insufficient documentation

## 2015-11-05 DIAGNOSIS — F17210 Nicotine dependence, cigarettes, uncomplicated: Secondary | ICD-10-CM | POA: Insufficient documentation

## 2015-11-05 DIAGNOSIS — Y658 Other specified misadventures during surgical and medical care: Secondary | ICD-10-CM | POA: Insufficient documentation

## 2015-11-05 DIAGNOSIS — K029 Dental caries, unspecified: Secondary | ICD-10-CM | POA: Insufficient documentation

## 2015-11-05 DIAGNOSIS — K047 Periapical abscess without sinus: Secondary | ICD-10-CM | POA: Insufficient documentation

## 2015-11-05 MED ORDER — HYDROCODONE-ACETAMINOPHEN 5-325 MG PO TABS: ORAL_TABLET | ORAL | Status: DC

## 2015-11-05 MED ORDER — PENICILLIN V POTASSIUM 500 MG PO TABS: 500.0000 mg | ORAL_TABLET | Freq: Three times a day (TID) | ORAL | Status: DC

## 2015-11-05 NOTE — Discharge Instructions (Signed)
Dental Care and Dentist Visits °Dental care supports good overall health. Regular dental visits can also help you avoid dental pain, bleeding, infection, and other more serious health problems in the future. It is important to keep the mouth healthy because diseases in the teeth, gums, and other oral tissues can spread to other areas of the body. Some problems, such as diabetes, heart disease, and pre-term labor have been associated with poor oral health.  °See your dentist every 6 months. If you experience emergency problems such as a toothache or broken tooth, go to the dentist right away. If you see your dentist regularly, you may catch problems early. It is easier to be treated for problems in the early stages.  °WHAT TO EXPECT AT A DENTIST VISIT  °Your dentist will look for many common oral health problems and recommend proper treatment. At your regular dental visit, you can expect: °· Gentle cleaning of the teeth and gums. This includes scraping and polishing. This helps to remove the sticky substance around the teeth and gums (plaque). Plaque forms in the mouth shortly after eating. Over time, plaque hardens on the teeth as tartar. If tartar is not removed regularly, it can cause problems. Cleaning also helps remove stains. °· Periodic X-rays. These pictures of the teeth and supporting bone will help your dentist assess the health of your teeth. °· Periodic fluoride treatments. Fluoride is a natural mineral shown to help strengthen teeth. Fluoride treatment involves applying a fluoride gel or varnish to the teeth. It is most commonly done in children. °· Examination of the mouth, tongue, jaws, teeth, and gums to look for any oral health problems, such as: °· Cavities (dental caries). This is decay on the tooth caused by plaque, sugar, and acid in the mouth. It is best to catch a cavity when it is small. °· Inflammation of the gums caused by plaque buildup (gingivitis). °· Problems with the mouth or malformed  or misaligned teeth. °· Oral cancer or other diseases of the soft tissues or jaws.  °KEEP YOUR TEETH AND GUMS HEALTHY °For healthy teeth and gums, follow these general guidelines as well as your dentist's specific advice: °· Have your teeth professionally cleaned at the dentist every 6 months. °· Brush twice daily with a fluoride toothpaste. °· Floss your teeth daily.  °· Ask your dentist if you need fluoride supplements, treatments, or fluoride toothpaste. °· Eat a healthy diet. Reduce foods and drinks with added sugar. °· Avoid smoking. °TREATMENT FOR ORAL HEALTH PROBLEMS °If you have oral health problems, treatment varies depending on the conditions present in your teeth and gums. °· Your caregiver will most likely recommend good oral hygiene at each visit. °· For cavities, gingivitis, or other oral health disease, your caregiver will perform a procedure to treat the problem. This is typically done at a separate appointment. Sometimes your caregiver will refer you to another dental specialist for specific tooth problems or for surgery. °SEEK IMMEDIATE DENTAL CARE IF: °· You have pain, bleeding, or soreness in the gum, tooth, jaw, or mouth area. °· A permanent tooth becomes loose or separated from the gum socket. °· You experience a blow or injury to the mouth or jaw area. °  °This information is not intended to replace advice given to you by your health care provider. Make sure you discuss any questions you have with your health care provider. °  °Document Released: 05/20/2011 Document Revised: 11/30/2011 Document Reviewed: 05/20/2011 °Elsevier Interactive Patient Education ©2016 Elsevier Inc. ° °Dental Caries °Dental   caries is tooth decay. This decay can cause a hole in teeth (cavity) that can get bigger and deeper over time. HOME CARE  Brush and floss your teeth. Do this at least two times a day.  Use a fluoride toothpaste.  Use a mouth rinse if told by your dentist or doctor.  Eat less sugary and  starchy foods. Drink less sugary drinks.  Avoid snacking often on sugary and starchy foods. Avoid sipping often on sugary drinks.  Keep regular checkups and cleanings with your dentist.  Use fluoride supplements if told by your dentist or doctor.  Allow fluoride to be applied to teeth if told by your dentist or doctor.   This information is not intended to replace advice given to you by your health care provider. Make sure you discuss any questions you have with your health care provider.     Emergency Department Resource Guide 1) Find a Doctor and Pay Out of Pocket Although you won't have to find out who is covered by your insurance plan, it is a good idea to ask around and get recommendations. You will then need to call the office and see if the doctor you have chosen will accept you as a new patient and what types of options they offer for patients who are self-pay. Some doctors offer discounts or will set up payment plans for their patients who do not have insurance, but you will need to ask so you aren't surprised when you get to your appointment.  2) Contact Your Local Health Department Not all health departments have doctors that can see patients for sick visits, but many do, so it is worth a call to see if yours does. If you don't know where your local health department is, you can check in your phone book. The CDC also has a tool to help you locate your state's health department, and many state websites also have listings of all of their local health departments.  3) Find a Walk-in Clinic If your illness is not likely to be very severe or complicated, you may want to try a walk in clinic. These are popping up all over the country in pharmacies, drugstores, and shopping centers. They're usually staffed by nurse practitioners or physician assistants that have been trained to treat common illnesses and complaints. They're usually fairly quick and inexpensive. However, if you have serious  medical issues or chronic medical problems, these are probably not your best option.  No Primary Care Doctor: - Call Health Connect at  [phone] - they can help you locate a primary care doctor that  accepts your insurance, provides certain services, etc. - Physician Referral Service- [phone]  Chronic Pain Problems: Organization         Address  Phone   Notes  [facility]  [phone] Patients need to be referred by their primary care doctor.   Medication Assistance: Organization         Address  Phone   Notes  [facility] Medication [facility] [address] [place] [place], [place] [address] [phone] --Must be a resident of [facility] -- Must have NO insurance coverage whatsoever (no Medicaid/ Medicare, etc.) -- The pt. MUST have a primary care doctor that directs their care regularly and follows them in the community   Med[facility]ist  [phone]   [employer]  [phone]    Agencies that provide inexpensive medical care: Organization  Address  Phone   Notes  [facility]  [phone]   [facility]    [phone]   [facility] [address] [place], [place] [address] [phone]   [facility] [address], [place] [phone]   Planned Parenthood    [phone]   [facility]    [phone]   [facility]  [address], [place] Phone:  [phone], Fax:  [phone] Hours of Operation:  9 am - 6 pm, M-F.  Also accepts Medicaid/Medicare and self-pay.  [facility]  [address], [place] Phone: [phone], Fax: [phone]. Hours of Operation:  8:30 am - 5:30 pm, M-F.  Also accepts Medicaid and self-pay.  [facility] [place] [address] [place] Phone: [phone]   [facility] [address] Natasha Bence  [place], [place] [phone], Ext. [phone] Mondays & Thursdays: 7-9 AM.  First 15 patients are seen on a first come, first serve basis.    Medicaid-accepting [facility] Providers:  Organization         Address  Phone   Notes  [facility] [place] [address], [place] [phone] Also accepts self-pay patients.  [facility] [address] Laurell Josephs [place], [place]  [phone]   [facility] [address], [place] [phone]   [facility] [address], [place] [phone]   Renaye Rakers [address], [place]   [phone] Only accepts [place] Access [place] patients after they have their name applied to their card.   Self-Pay (no insurance) in [facility]:  Organization         Address  Phone   Notes  Sickle Cell Patients, [facility] Internal Medicine [address] [place], [place] [phone]   [facility] [address] [place], [place] [phone]   [facility] [place]  [address], [place] [phone]   [facility]/Dr. Osei-Bonsu  [address], [place] or [address], [place] [phone] Phone number for both [place] and [place] locations is the same.  [facility] [address] [place] [phone]   [facility] [address], [place] or [address] [phone]   [facility] [address] [place] [phone], phone; [phone], fax Sees patients 1st and 3rd Saturday of every month.  Must not qualify for public or private insurance (i.e. Medicaid, Medicare, [place] Health Choice, Veterans' Benefits)  Household income should be no more than 200% of the poverty level The clinic cannot treat you if you are pregnant or think you are pregnant  Sexually  transmitted diseases are not treated at the clinic.    Dental Care: Organization         Address  Phone  Notes  [facility] Department of [facility] [address] [place], [place] [phone] Accepts children up to age 10 who are enrolled in [place] or [place] Health Choice; pregnant women with a Medicaid card; and children who have applied for Medicaid or [place] Health Choice, but were declined, whose parents can pay a reduced fee at time  of service.  [facility] Department of [employer]  [address], [place] [phone] Accepts children up to age 52 who are enrolled in [place] or [place] Health Choice; pregnant women with a Medicaid card; and children who have applied for Medicaid or [place] Health Choice, but were declined, whose parents can pay a reduced fee at time of service.  [facility]  [address] [place], [place] [phone] Patients are seen by appointment only. Walk-ins are not accepted. [facility] will see patients 85 years of age and older. Monday - Tuesday (8am-5pm) Most Wednesdays (8:30-5pm) $30 per visit, cash only  [facility] Adult Dental Access PROGRAM  [address], [facility] [phone] Patients are seen by appointment only. Walk-ins are not accepted. [facility] will see patients 25 years of age and older. One Wednesday Evening (Monthly: Volunteer Based).  $30 per visit, cash only  [employer]  [phone] for adults; Children under age 39, call Graduate Pediatric Dentistry at [phone]. Children aged 47-14, please call [phone] to request a pediatric application.  Dental services are provided in all areas of dental care including fillings, crowns and bridges, complete and partial dentures, implants, gum treatment, root canals, and extractions. Preventive care is also provided. Treatment is provided to both adults and children. Patients are  selected via a lottery and there is often a waiting list.   [facility] [address], [place]  [phone] [email or web address]   [facility] [address] [place], [place] [phone], Ext. [phone] Second and Fourth Thursday of each month, opens at 6:30 AM; Clinic ends at 9 AM.  Patients are seen on a first-come first-served basis, and a limited number are seen during each clinic.   [facility]  [address] Ether Griffins [place], [place] [phone]   Eligibility Requirements You must have lived in [place], [place], or [place] counties for at least the last three months.   You cannot be eligible for state or federal sponsored [place], including [employer], [place], or [employer].   You generally cannot be eligible for healthcare insurance through your employer.    How to apply: Eligibility screenings are held every Tuesday and Wednesday afternoon from 1:00 pm until 4:00 pm. You do not need an appointment for the interview!  [facility] [address] [place], [place] [phone]   [facility] Department  [phone]   [facility] Health Department  [phone]   [facility] Health Department  [phone]    Behavioral Health Resources in the Community: Intensive Outpatient Programs Organization         Address  Phone  Notes  [facility] Services [address] [place], [place] [phone]   [facility] Outpatient [address] [place], [place] [phone]   [facility] [address] [place], [place]  [phone]   [facility] Mental Health [address],  [place], [place] [phone] or [phone]   Substance Abuse Resources Organization         Address  Phone  Notes  [facility]  [phone]   [facility]  [phone]   [facility]  [phone]   Floydene Flock  [phone]     Residential & Outpatient Substance Abuse Program  [phone]   Psychological Services Organization         Address  Phone  Notes  [employer] [facility]  [phone]   [facility]  [phone]   [facility] Mental Health [address], [place] [phone] or [phone]    Mobile Crisis Teams Organization         Address  Phone  Notes  [facility], Mobile Crisis Care Unit  [phone]   [facility]  [address] [place], [place] [phone]   Doristine Locks [address] [place] [phone]    Self-Help/Support Groups Organization         Address  Phone             Notes  [facility] [place] - variety of support groups  336- I7437963 Call for more information  Narcotics Anonymous (NA), [facility] [address], [employer] [place]  2 meetings at this location   Statistician         Address  Phone  Notes  [facility] [address],    [place]  [phone]   [facility]  [address], [place] [address], [place], [place] [phone]   [facility] [address] [place], [place] [phone] Admissions: 8am-3pm M-F  [facility] [address]    [place], [place] [phone]   [facility] [address] [place], [place], [place] [phone]   [facility] [address]  [place], [place] [phone]   [facility] - Intensive Outpatient [address] Laurell Josephs [address], [place], [place] [phone]   [facility] ([facility] [address] [place]  [place], [place] [phone] or [phone]   [facility] ([facility]) [address] [place], [place] [phone] Accepts Medicaid  [facility] [address]  [place] [phone] Substance Abuse/Addiction Treatment   [facility] Organization         Address  Phone  Notes  [facility]  [phone]   Angie Fava, PhD [address] Ervin Knack [place], [place]   [phone] or [phone]   [facility] Behavioral   [address] [place], [place] [phone]   [facility] [address] [place], [place] [phone] Insurance/Medicaid/sponsorship through [facility] and Families [address]                                    [place], [place] [phone] Therapy/tele-psych/case  [facility] [address] [place], [place] [phone]    Dr. Lolly Mustache  [phone]   [facility]  [facility] Dept. 1) [address], [place] 2) [address], [place] 3)  [address], [place] [phone]  [phone]   [facility] Child Abuse Hotline 970-031-4113 or [phone] (After Hours)

## 2015-11-05 NOTE — ED Notes (Signed)
Pt reports pain to left lower tooth since last pm, swelling noted to left lower jaw

## 2015-11-28 NOTE — ED Provider Notes (Signed)
CSN: [id number]     Arrival date & time 11/05/15  [id number] History   First MD Initiated Contact with Patient 11/05/15 [phone]     Chief Complaint  Patient presents with  . Oral Swelling      HPI  Expand All Collapse All   Pt reports pain to left lower tooth since last pm, swelling noted to left lower jaw        History reviewed. No pertinent past medical history. Past Surgical History  Procedure Laterality Date  . Tendon repair l ring finger     Family History  Problem Relation Age of Onset  . Hypertension Sister    Social History  Substance Use Topics  . Smoking status: Current Every Day Smoker -- 0.50 packs/day    Types: Cigarettes  . Smokeless tobacco: Never Used  . Alcohol Use: Yes    Review of Systems  All other systems reviewed and are negative.     Allergies  Review of patient's allergies indicates no known allergies.  Home Medications   Prior to Admission medications   Medication Sig Start Date End Date Taking? Authorizing Provider  amoxicillin (AMOXIL) 500 MG capsule Take 1 capsule (500 mg total) by mouth 3 (three) times daily. 09/29/15   Nicole Pisciotta, PA-C  HYDROcodone-acetaminophen (NORCO/VICODIN) 5-325 MG tablet Take 1-2 tablets by mouth every 6 hours as needed for pain and/or cough. 11/05/15   Nelva Nayobert Dayrin Stallone, MD  penicillin v potassium (VEETID) 500 MG tablet Take 1 tablet (500 mg total) by mouth 3 (three) times daily. 11/05/15   Nelva Nayobert Octave [place], MD  permethrin (ELIMITE) 5 % cream Apply to entire body neck down, wash off after 8 hrs. Repeat in 1 week. 01/14/13   Tatyana Kirichenko, PA-C  permethrin (ELIMITE) 5 % cream Apply to affected area once 02/10/13   Jerelyn ScottMartha Linker, MD  sulfamethoxazole-trimethoprim (BACTRIM DS,SEPTRA DS) 800-160 MG per tablet Take 1 tablet by mouth 2 (two) times daily.    Historical Provider, MD   BP 143/101 mmHg  Pulse 84  Temp(Src) 98.5 F (36.9 C) (Oral)  Resp 20  Ht 6\' 1"  (1.854 m)  Wt 320 lb (145.151 kg)  BMI 42.23 kg/m2  SpO2  99% Physical Exam  Constitutional: He is oriented to person, place, and time. He appears well-developed and well-nourished. No distress.  HENT:  Head: Normocephalic and atraumatic.  Mouth/Throat: Dental abscesses present.  Eyes: Pupils are equal, round, and reactive to light.  Neck: Normal range of motion.  Cardiovascular: Normal rate and intact distal pulses.   Pulmonary/Chest: No respiratory distress.  Abdominal: Normal appearance. He exhibits no distension.  Musculoskeletal: Normal range of motion.  Neurological: He is alert and oriented to person, place, and time. No cranial nerve deficit.  Skin: Skin is warm and dry. No rash noted.  Psychiatric: He has a normal mood and affect. His behavior is normal.  Nursing note and vitals reviewed.   ED Course  Procedures (including critical care time) Labs Review Labs Reviewed - No data to display  Imaging Review No results found. I have personally reviewed and evaluated these images and lab results as part of my medical decision-making.   EKG Interpretation None      MDM   Final diagnoses:  Dental caries  Dental implant pain, initial encounter        Nelva Nayobert Khala Tarte, MD 11/28/15 1547

## 2016-04-28 ENCOUNTER — Encounter: Payer: Self-pay | Admitting: Emergency Medicine

## 2016-04-28 ENCOUNTER — Emergency Department
Admission: EM | Admit: 2016-04-28 | Discharge: 2016-04-29 | Disposition: A | Discharge status: Home / Self Care | Payer: Medicaid Other | Attending: Emergency Medicine | Admitting: Emergency Medicine

## 2016-04-28 DIAGNOSIS — F17210 Nicotine dependence, cigarettes, uncomplicated: Secondary | ICD-10-CM | POA: Insufficient documentation

## 2016-04-28 DIAGNOSIS — R112 Nausea with vomiting, unspecified: Secondary | ICD-10-CM

## 2016-04-28 MED ORDER — DICYCLOMINE HCL 10 MG/ML IM SOLN
20.0000 mg | Freq: Once | INTRAMUSCULAR | Status: AC
  Administered 2016-04-28: 20 mg via INTRAMUSCULAR
  Filled 2016-04-28: qty 2

## 2016-04-28 MED ORDER — ONDANSETRON HCL 4 MG/2ML IJ SOLN
4.0000 mg | Freq: Once | INTRAMUSCULAR | Status: AC
  Administered 2016-04-28: 4 mg via INTRAVENOUS
  Filled 2016-04-28: qty 2

## 2016-04-28 MED ORDER — SODIUM CHLORIDE 0.9 % IV BOLUS (SEPSIS): 1000.0000 mL | Freq: Once | INTRAVENOUS | Status: DC

## 2016-04-28 MED ORDER — CLONIDINE HCL 0.1 MG PO TABS
0.1000 mg | ORAL_TABLET | Freq: Once | ORAL | Status: DC
  Filled 2016-04-28: qty 1

## 2016-04-28 NOTE — ED Provider Notes (Signed)
[facility] Emergency Department Provider Note  ____________________________________________  Time seen: Approximately 10:14 PM  I have reviewed the triage vital signs and the nursing notes.   HISTORY  Chief Complaint Emesis    HPI Carlos Duran is a 45 y.o. male who complains of generalized abdominal pain and nausea and vomiting. 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 Last heroin use was 2 days ago. He started feeling bad yesterday. Last ate yesterday. No prior surgeries. No diarrhea. No other symptoms such as chest pain shortness breath fever.     History reviewed. No pertinent past medical history.   There are no active problems to display for this patient.    Past Surgical History:  Procedure Laterality Date  . Tendon repair L ring finger       Prior to Admission medications   Medication Sig Start Date End Date Taking? Authorizing Provider  amoxicillin (AMOXIL) 500 MG capsule Take 1 capsule (500 mg total) by mouth 3 (three) times daily. 09/29/15   Nicole Pisciotta, PA-C  HYDROcodone-acetaminophen (NORCO/VICODIN) 5-325 MG tablet Take 1-2 tablets by mouth every 6 hours as needed for pain and/or cough. 11/05/15   Nelva Nay, MD  penicillin v potassium (VEETID) 500 MG tablet Take 1 tablet (500 mg total) by mouth 3 (three) times daily. 11/05/15   Nelva Nay, MD  permethrin (ELIMITE) 5 % cream Apply to entire body neck down, wash off after 8 hrs. Repeat in 1 week. 01/14/13   Tatyana Kirichenko, PA-C  permethrin (ELIMITE) 5 % cream Apply to affected area once 02/10/13   Jerelyn Scott, MD  sulfamethoxazole-trimethoprim (BACTRIM DS,SEPTRA DS) 800-160 MG per tablet Take 1 tablet by mouth 2 (two) times daily.    Historical Provider, MD     Allergies Review of patient's allergies indicates no known allergies.   Family History  Problem Relation Age of Onset  . Hypertension Sister     Social History Social History  Substance Use Topics  .  Smoking status: Current Every Day Smoker    Packs/day: 0.50    Types: Cigarettes  . Smokeless tobacco: Never Used  . Alcohol use No    Review of Systems  Constitutional:   No feverOr positive chills.  ENT:   No sore throat. No rhinorrhea. Cardiovascular:   No chest pain. Respiratory:   No dyspnea or cough. Gastrointestinal:   Positive generalized abdominal pain with nausea vomiting. No diarrhea or constipation..  Genitourinary:   Negative for dysuria or difficulty urinating. Musculoskeletal:   Negative for focal pain or swelling Neurological:   Negative for headaches 10-point ROS otherwise negative.  ____________________________________________   PHYSICAL EXAM:  VITAL SIGNS: ED Triage Vitals  Enc Vitals Group     BP 04/28/16 2211 (!) 136/93     Pulse Rate 04/28/16 2211 81     Resp 04/28/16 2211 12     Temp 04/28/16 2211 97.4 F (36.3 C)     Temp Source 04/28/16 2211 Oral     SpO2 04/28/16 2211 99 %     Weight 04/28/16 2211 300 lb (136.1 kg)     Height 04/28/16 2211 6' (1.829 m)     Head Circumference --      Peak Flow --      Pain Score 04/28/16 2212 10     Pain Loc --      Pain Edu? --      Excl. in GC? --     Vital signs reviewed, nursing assessments reviewed.  Constitutional:   Alert and oriented. Well appearing and in no distress.Not currently vomiting Eyes:   No scleral icterus. No conjunctival pallor. PERRL. EOMI.  No nystagmus. No myosis ENT   Head:   Normocephalic and atraumatic.   Nose:   No congestion/rhinnorhea. No septal hematoma   Mouth/Throat:   MMM, no pharyngeal erythema. No peritonsillar mass.    Neck:   No stridor. No SubQ emphysema. No meningismus. Hematological/Lymphatic/Immunilogical:   No cervical lymphadenopathy. Cardiovascular:   RRR. Symmetric bilateral radial and DP pulses.  No murmurs.  Respiratory:   Normal respiratory effort without tachypnea nor retractions. Breath sounds are clear and equal bilaterally. No  wheezes/rales/rhonchi. Gastrointestinal:   Soft and nontender. Non distended. There is no CVA tenderness.  No rebound, rigidity, or guarding. Normoactive bowel sounds Genitourinary:   deferred Musculoskeletal:   Nontender with normal range of motion in all extremities. No joint effusions.  No lower extremity tenderness.  No edema. Neurologic:   Normal speech and language.  CN 2-10 normal. Motor grossly intact. No gross focal neurologic deficits are appreciated.  Skin:    Skin is warm and moist with piloerection.. No rash noted.  No petechiae, purpura, or bullae.  ____________________________________________    LABS (pertinent positives/negatives) (all labs ordered are listed, but only abnormal results are displayed) Labs Reviewed - No data to display ____________________________________________   EKG  Interpreted by me Normal sinus rhythm rate of 81, normal axis intervals QRS ST segments and T waves  ____________________________________________    RADIOLOGY    ____________________________________________   PROCEDURES Procedures  ____________________________________________   INITIAL IMPRESSION / ASSESSMENT AND PLAN / ED COURSE  Pertinent labs & imaging results that were available during my care of the patient were reviewed by me and considered in my medical decision making (see chart for details).  Patient presents with nausea vomiting generalized abdominal pain in the setting of heroin detox. Exam is reassuring, vital signs are reassuring. Low suspicion for appendicitis cholecystitis pancreatitis perforation obstruction. We'll plan to give the patient IV fluids, IV Zofran, intramuscular Bentyl. We'll reassess. If symptoms are controllable, plans to return patient to [facility] to continue his detox program.     Clinical Course    ----------------------------------------- 3:31 PM on 04/29/2016 -----------------------------------------  Due to electronic medical  record system failure and downtime during the time of the patient's evaluation, encounter note was unable to be completed at the time of evaluation. Care of the patient was transferred to Dr. York CeriseForbach at about midnight last night pending labs fluids and reassessment. I rechecked the patient at about 11:15 PM, found that he was sleeping comfortably in bed. I requested the nurse to do a trial of oral intake with the patient since his symptoms appear to be resolved. Notably, he did not have an emesis bag anywhere near him and seemed to be quite comfortable. ____________________________________________   FINAL CLINICAL IMPRESSION(S) / ED DIAGNOSES  Final diagnoses:  Non-intractable vomiting with nausea, vomiting of unspecified type       Portions of this note were generated with dragon dictation software. Dictation errors may occur despite best attempts at proofreading.    Sharman CheekPhillip Adine Heimann, MD 04/29/16 [phone]

## 2016-04-28 NOTE — ED Notes (Signed)
ED unsuccessful in retrieving blood after multiple attempts from 2 RN's, 1 medic; lab notified to stick pt.

## 2016-04-28 NOTE — ED Triage Notes (Signed)
Pt in via EMS from [facility] for heroin detox.  Pt reports being there for 3 days, currently with complaints of nausea/vomiting since then.  Pt alert to self only, lethargic, drifting off to sleep frequently.  Pt vitals WDL, no immediate distress at this time.  MD at bedside.

## 2016-04-29 MED ORDER — ONDANSETRON 4 MG PO TBDP
ORAL_TABLET | ORAL | Status: AC
  Filled 2016-04-29: qty 1

## 2016-04-29 NOTE — ED Notes (Signed)
Patient DC on paper due to unplanned system downtime.

## 2016-04-29 NOTE — ED Provider Notes (Signed)
-----------------------------------------   1:17 AM on 04/29/2016 -----------------------------------------  I assumed care from Dr. Scotty CourtStafford at approximately midnight.  In short, the patient was having persistent vomiting and abdominal pain and was sent from [facility] for evaluation.  An IV was obtained in his foot but then it was subsequently lost.  No blood was sent; additional peripheral IV attempts were made by ultrasound guidance but the patient would not tolerate the sticks and was not able to hold still enough to be able to establish an IV.  However, with ODT Zofran the patient felt much better was able take a nap.  His abdominal exam was benign and he was able to tolerate a PO challenge without difficulty.  He was comfortable going back to [facility] and he was discharged in a taxi back to [facility] after charge nurse authorization.  His vital signs are stable, he was ambulatory without difficulty, and he was in no distress.   Loleta Roseory Shantana Christon, MD 04/29/16 [phone]

## 2017-05-26 ENCOUNTER — Ambulatory Visit (HOSPITAL_BASED_OUTPATIENT_CLINIC_OR_DEPARTMENT_OTHER): Payer: Self-pay

## 2017-05-26 ENCOUNTER — Emergency Department (HOSPITAL_BASED_OUTPATIENT_CLINIC_OR_DEPARTMENT_OTHER)
Admission: EM | Admit: 2017-05-26 | Discharge: 2017-05-26 | Discharge status: Against Medical Advice | Payer: Self-pay | Attending: Emergency Medicine | Admitting: Emergency Medicine

## 2017-05-26 ENCOUNTER — Emergency Department (HOSPITAL_BASED_OUTPATIENT_CLINIC_OR_DEPARTMENT_OTHER): Payer: Self-pay

## 2017-05-26 ENCOUNTER — Encounter (HOSPITAL_BASED_OUTPATIENT_CLINIC_OR_DEPARTMENT_OTHER): Payer: Self-pay

## 2017-05-26 DIAGNOSIS — R2241 Localized swelling, mass and lump, right lower limb: Secondary | ICD-10-CM | POA: Insufficient documentation

## 2017-05-26 DIAGNOSIS — M7989 Other specified soft tissue disorders: Secondary | ICD-10-CM

## 2017-05-26 DIAGNOSIS — F17210 Nicotine dependence, cigarettes, uncomplicated: Secondary | ICD-10-CM | POA: Insufficient documentation

## 2017-05-26 HISTORY — DX: Other psychoactive substance abuse, uncomplicated: F19.10

## 2017-05-26 NOTE — ED Notes (Signed)
Pt went to x-ray.

## 2017-05-26 NOTE — ED Provider Notes (Signed)
[facility] Provider Note   CSN: [id number] Arrival date & time: 05/26/17  1052     History   Chief Complaint Chief Complaint  Patient presents with  . Leg Swelling    HPI Carlos Duran is a 46 y.o. male.  The history is provided by the patient. No language interpreter was used.   Carlos Duran is a 46 y.o. male who presents to the Emergency Department complaining of leg swelling.  One week ago he fell and struck his right leg. Over the last week he's noticed increased pain and swelling to the right lower extremity from the shin down to the ankle. He denies any fevers, chest pain, shortness of breath. He is using a postop shoe because he cannot fit his foot and his right shoe. He denies any medical problems. Past Medical History:  Diagnosis Date  . IV drug abuse     There are no active problems to display for this patient.   Past Surgical History:  Procedure Laterality Date  . Tendon repair L ring finger         Home Medications    Prior to Admission medications   Not on File    Family History Family History  Problem Relation Age of Onset  . Hypertension Sister     Social History Social History  Substance Use Topics  . Smoking status: Current Every Day Smoker    Packs/day: 0.50    Types: Cigarettes  . Smokeless tobacco: Never Used  . Alcohol use No     Allergies   Patient has no known allergies.   Review of Systems Review of Systems  All other systems reviewed and are negative.    Physical Exam Updated Vital Signs BP 117/80 (BP Location: Left Arm)   Pulse 84   Temp 98.3 F (36.8 C) (Oral)   Resp 18   Ht 6' (1.829 m)   Wt (!) 139 kg (306 lb 7 oz)   SpO2 99%   BMI 41.56 kg/m   Physical Exam  Constitutional: He is oriented to person, place, and time. He appears well-developed and well-nourished.  HENT:  Head: Normocephalic and atraumatic.  Cardiovascular: Normal rate and regular rhythm.   No murmur heard. Pulmonary/Chest:  Effort normal and breath sounds normal. No respiratory distress.  Abdominal: Soft. There is no tenderness. There is no rebound and no guarding.  Musculoskeletal: He exhibits edema.  2+ DP pulses to the right lower extremity. There is 3+ pitting edema to the right foot and leg. There is focal swelling, fluctuance to the mid shin with mild local erythema.  Neurological: He is alert and oriented to person, place, and time.  Skin: Skin is warm and dry.  Psychiatric: He has a normal mood and affect. His behavior is normal.  Nursing note and vitals reviewed.    ED Treatments / Results  Labs (all labs ordered are listed, but only abnormal results are displayed) Labs Reviewed  BASIC METABOLIC PANEL  CBC WITH DIFFERENTIAL/PLATELET    EKG  EKG Interpretation None       Radiology No results found.  Procedures Procedures (including critical care time)  Medications Ordered in ED Medications - No data to display   Initial Impression / Assessment and Plan / ED Course  I have reviewed the triage vital signs and the nursing notes.  Pertinent labs & imaging results that were available during my care of the patient were reviewed by me and considered in my medical decision making (see  chart for details).     Patient here for evaluation of leg swelling after an injury. Examination is concerning for cellulitis with possible abscess. Also concern for DVT given the degree of swelling. Patient eloped from department prior to completing evaluation.  Final Clinical Impressions(s) / ED Diagnoses   Final diagnoses:  None    New Prescriptions New Prescriptions   No medications on file     Tilden Fossa, MD 05/27/17 [phone]

## 2017-05-26 NOTE — ED Triage Notes (Signed)
Pt reports hx of swelling to LE-increase in swelling to right LE x 1 week after injured leg-redness/swelling noted to right LE-pt wearing post op style show-states he is unable to get foot in other shoes-has not been seen for c/o-NAD-limping gait

## 2017-05-26 NOTE — ED Notes (Signed)
Attempted to draw blood for labs, unsuccessful. States was former IV drug user, multiple scars noted to hands and arms. ED   MD informed and instructed to cancel blood work

## 2017-05-26 NOTE — ED Notes (Signed)
Refused U/S, states" If I don't get me some antibiotics, Im leaving" ED MD informed

## 2017-05-26 NOTE — ED Notes (Signed)
Went to obtain vitals patient refuses vitals at this time states " I don't need vitals every 2 hours I'm not dying" RN Sophie made aware.

## 2019-01-04 IMAGING — DX DG TIBIA/FIBULA 2V*R*
4 series · 4 of 4 positions shown · non-contrast
Comparison: None.

CLINICAL DATA: Fall 1 week ago.  Draining wound.

EXAM:
RIGHT TIBIA AND FIBULA - 2 VIEW

[tibia ap (1 of 2)]
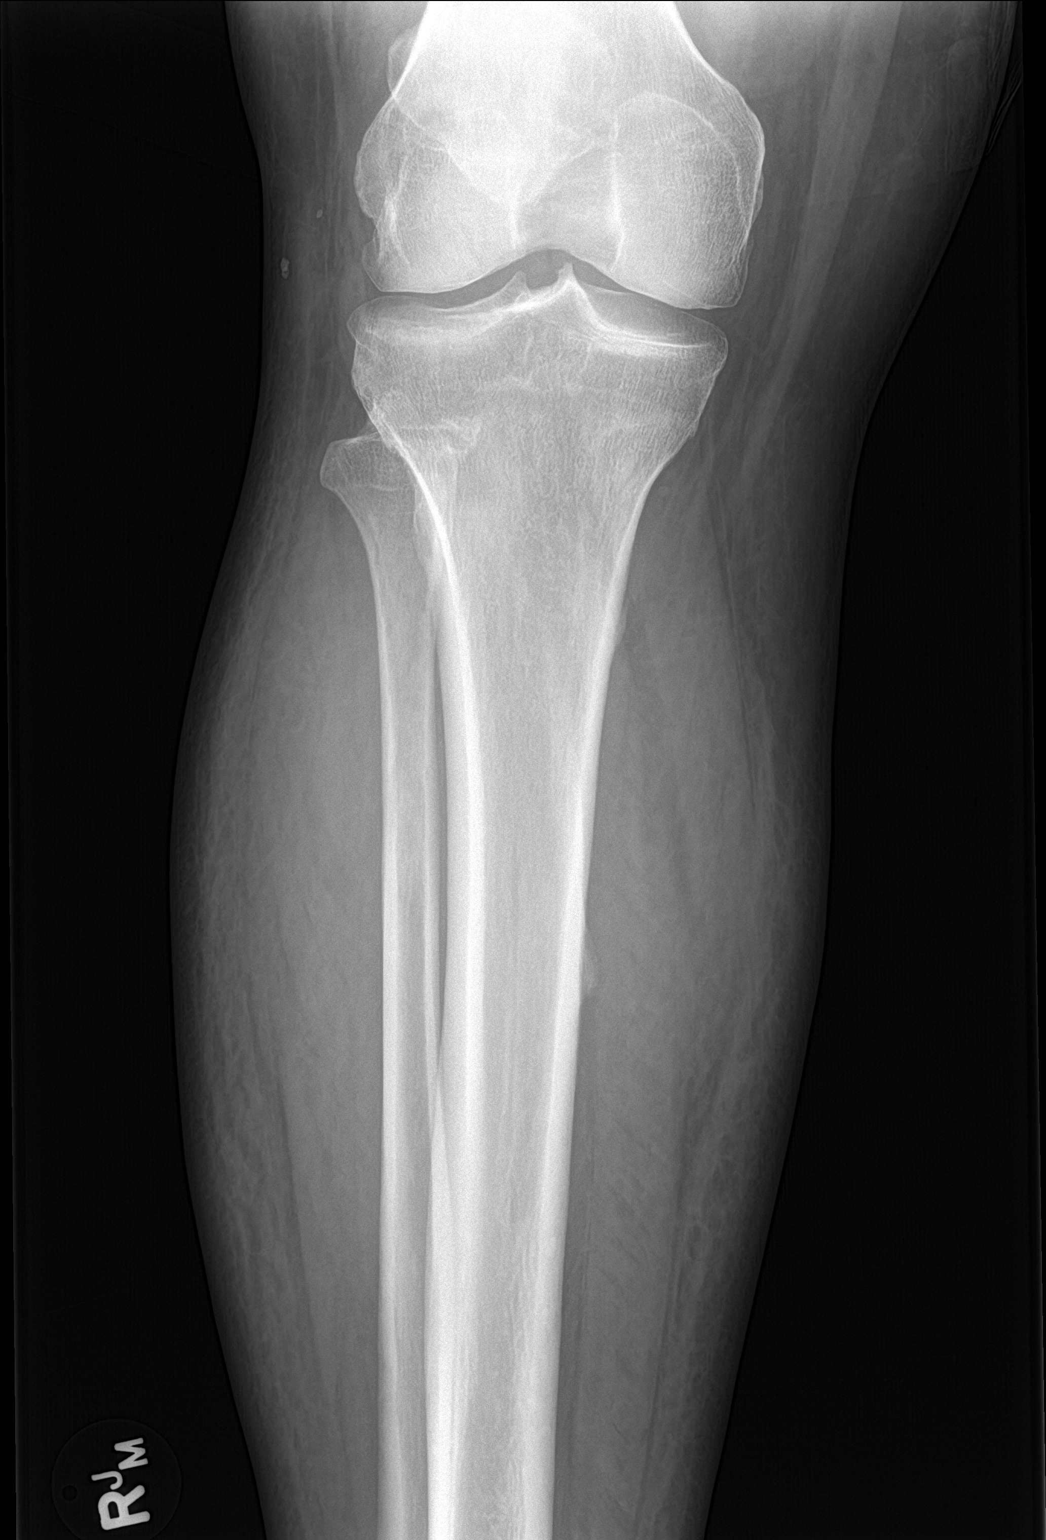

[tibia ap (2 of 2)]
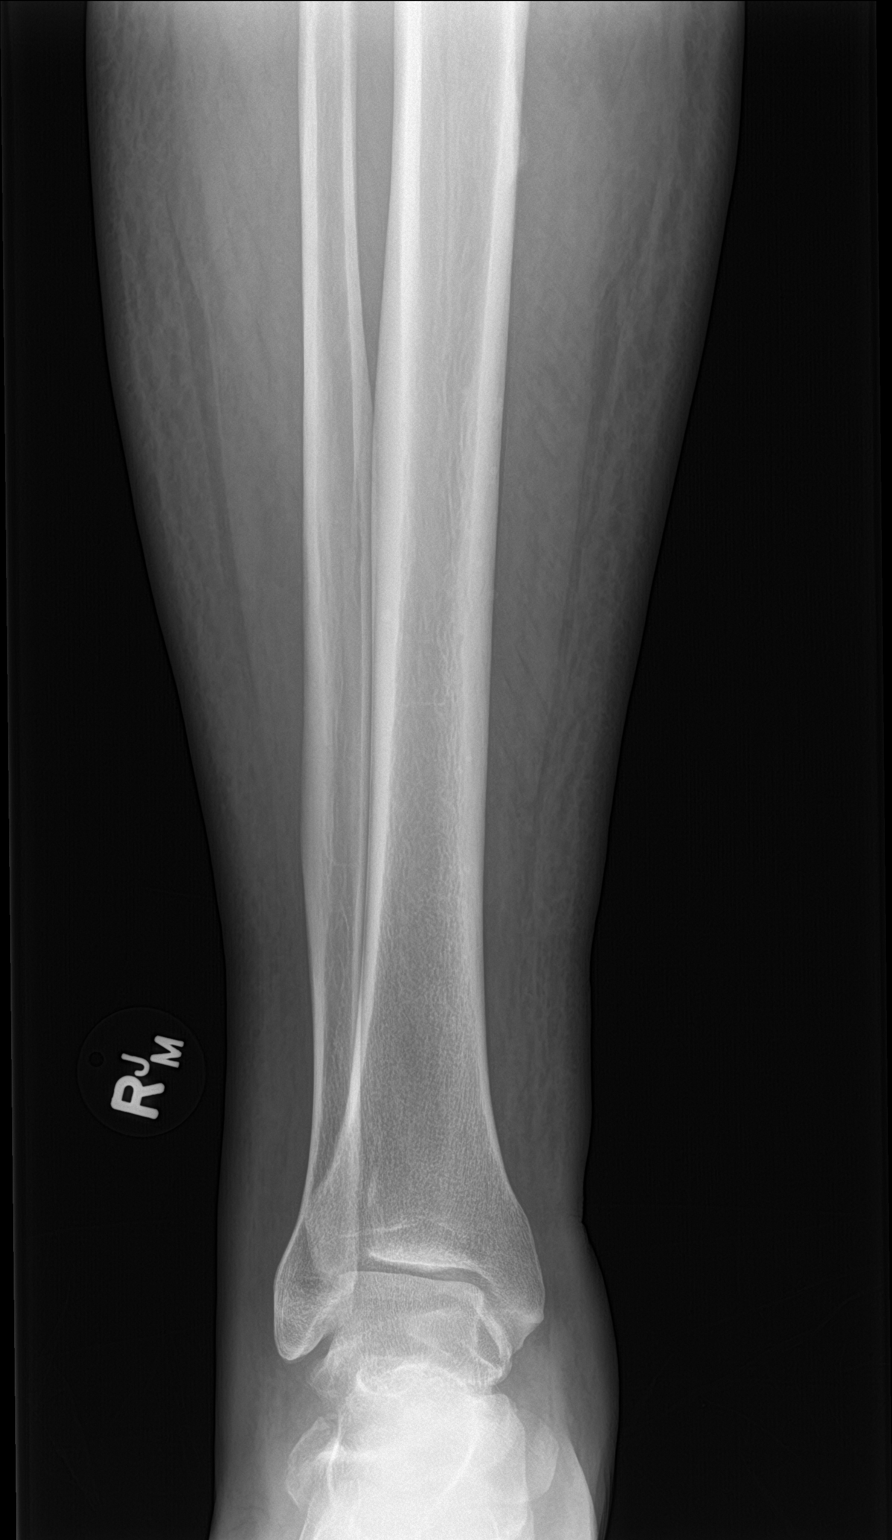

[tibia lat (1 of 2)]
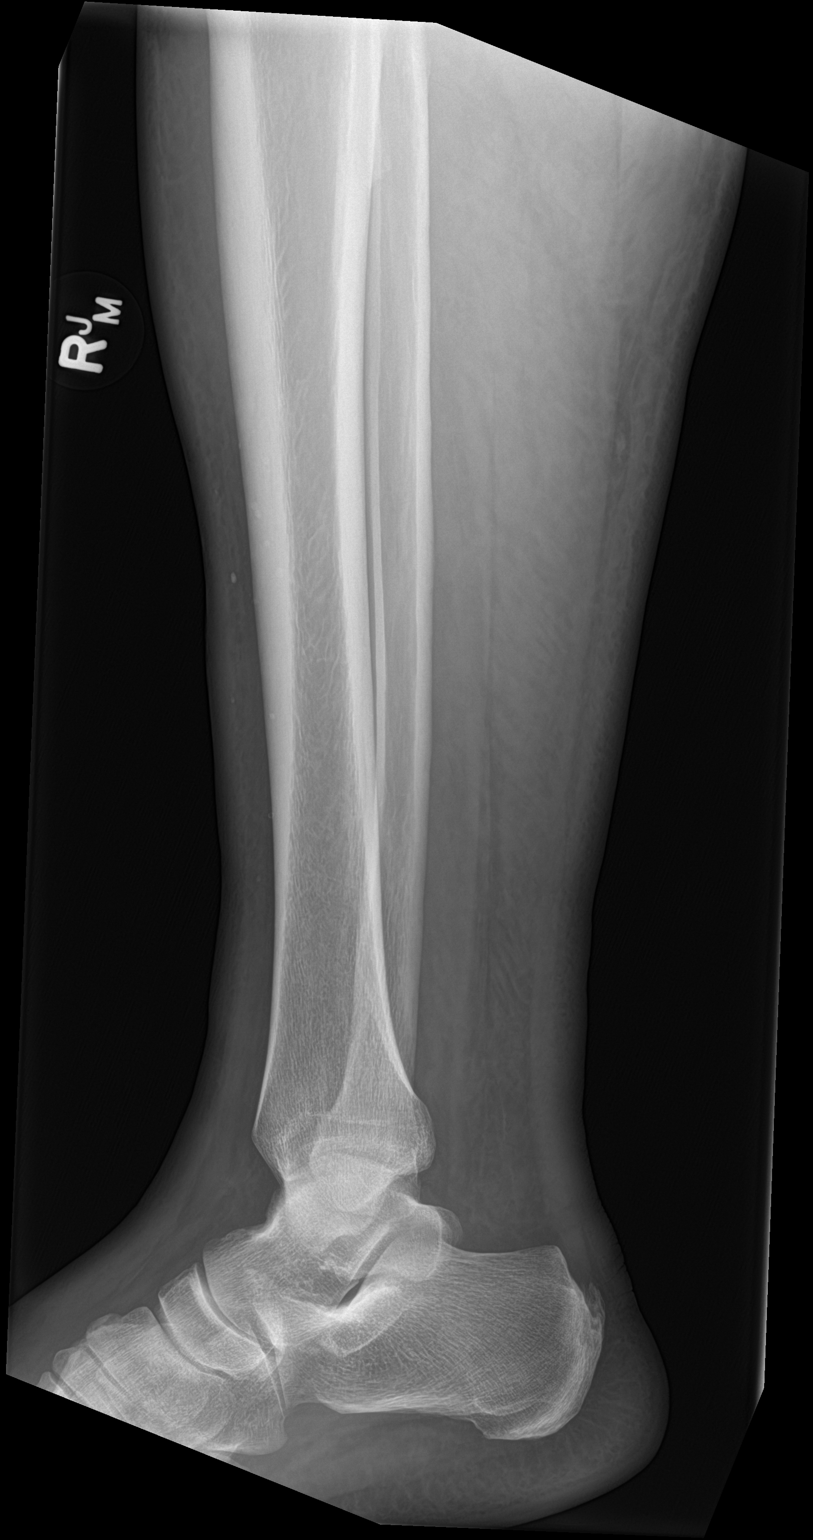

[tibia lat (2 of 2)]
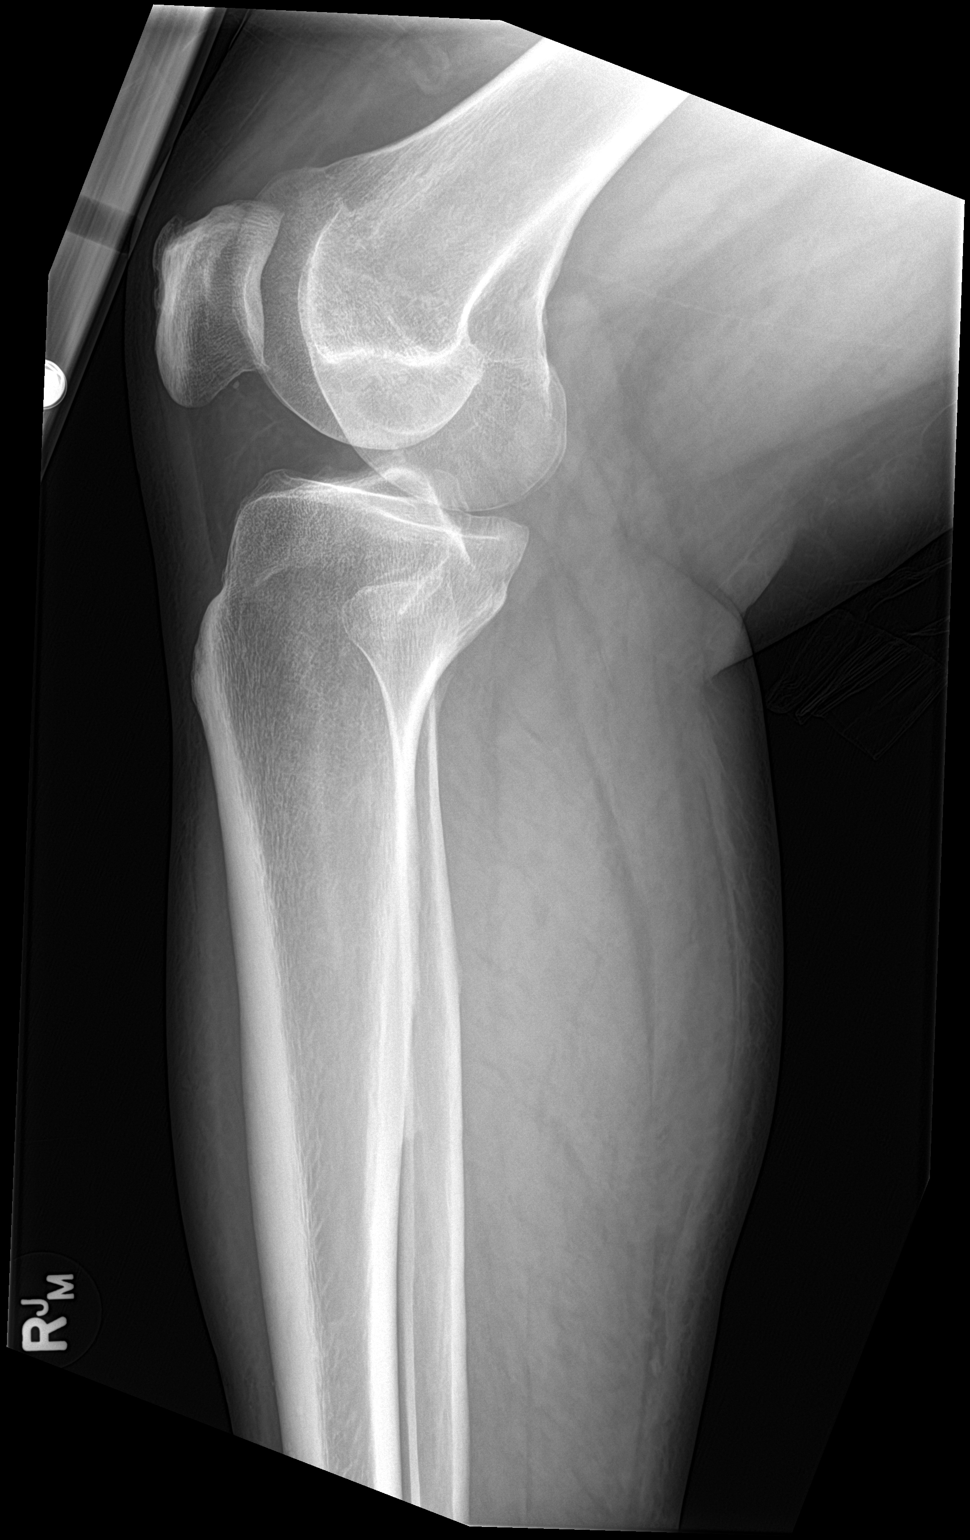

[4 of 4 positions shown; findings below may reference images not displayed]

FINDINGS: There is no evidence of fracture or other focal bone lesions. Soft
tissues are unremarkable.
IMPRESSION: Negative.

## 2019-04-04 ENCOUNTER — Emergency Department (HOSPITAL_BASED_OUTPATIENT_CLINIC_OR_DEPARTMENT_OTHER)
Admission: EM | Admit: 2019-04-04 | Discharge: 2019-04-04 | Disposition: A | Discharge status: Home / Self Care | Payer: Medicaid Other | Attending: Emergency Medicine | Admitting: Emergency Medicine

## 2019-04-04 ENCOUNTER — Encounter (HOSPITAL_BASED_OUTPATIENT_CLINIC_OR_DEPARTMENT_OTHER): Payer: Self-pay | Admitting: Emergency Medicine

## 2019-04-04 ENCOUNTER — Other Ambulatory Visit: Payer: Self-pay

## 2019-04-04 DIAGNOSIS — K0889 Other specified disorders of teeth and supporting structures: Secondary | ICD-10-CM

## 2019-04-04 DIAGNOSIS — K029 Dental caries, unspecified: Secondary | ICD-10-CM | POA: Insufficient documentation

## 2019-04-04 DIAGNOSIS — F17210 Nicotine dependence, cigarettes, uncomplicated: Secondary | ICD-10-CM | POA: Insufficient documentation

## 2019-04-04 HISTORY — DX: Obesity, unspecified: E66.9

## 2019-04-04 MED ORDER — PENICILLIN V POTASSIUM 500 MG PO TABS: 500.0000 mg | ORAL_TABLET | Freq: Four times a day (QID) | ORAL | 0 refills | Status: AC

## 2019-04-04 MED ORDER — NAPROXEN 500 MG PO TABS: 500.0000 mg | ORAL_TABLET | Freq: Two times a day (BID) | ORAL | 0 refills | Status: AC

## 2019-04-04 MED FILL — NAPROXEN 500 MG TABLET: 500 | 15 days supply | Qty: 30 | Fill #0

## 2019-04-04 MED FILL — PENICILLIN VK 500 MG TABLET: 500 | 7 days supply | Qty: 28 | Fill #0

## 2019-04-04 NOTE — ED Notes (Signed)
ED Provider at bedside. 

## 2019-04-04 NOTE — ED Triage Notes (Signed)
Broken tooth on bottom left.  Had abx for it from dentist but pt could not afford to get it fixed so now it is hurting again since last night.

## 2019-04-04 NOTE — ED Provider Notes (Signed)
[facility] Emergency Department Provider Note MRN:  [id number]  Arrival date & time: 04/04/19     Chief Complaint   Dental Pain   History of Present Illness   Carlos Duran is a 48 y.o. year-old male with a history of obesity, IV drug use presenting to the ED with chief complaint of dental pain.  Pain is located in the lower left tooth, present for 2 to 3 days, worsening last night.  Denies fever, no trouble swallowing, no shortness of breath, no other symptoms.  Pain is constant, moderate, worse with palpation.  Review of Systems  A problem-focused ROS was performed. Positive for tooth pain.  Patient denies fever.  Patient's Health History    Past Medical History:  Diagnosis Date  . IV drug abuse ([place])   . Obesity     Past Surgical History:  Procedure Laterality Date  . Tendon repair L ring finger      Family History  Problem Relation Age of Onset  . Hypertension Sister     Social History   Socioeconomic History  . Marital status: Single    Spouse name: Not on file  . Number of children: Not on file  . Years of education: Not on file  . Highest education level: Not on file  Occupational History  . Not on file  Social Needs  . Financial resource strain: Not on file  . Food insecurity    Worry: Not on file    Inability: Not on file  . Transportation needs    Medical: Not on file    Non-medical: Not on file  Tobacco Use  . Smoking status: Current Every Day Smoker    Packs/day: 0.50    Types: Cigarettes  . Smokeless tobacco: Never Used  Substance and Sexual Activity  . Alcohol use: No  . Drug use: No  . Sexual activity: Not on file  Lifestyle  . Physical activity    Days per week: Not on file    Minutes per session: Not on file  . Stress: Not on file  Relationships  . Social Herbalist on phone: Not on file    Gets together: Not on file    Attends religious service: Not on file    Active member of club or  organization: Not on file    Attends meetings of clubs or organizations: Not on file    Relationship status: Not on file  . Intimate partner violence    Fear of current or ex partner: Not on file    Emotionally abused: Not on file    Physically abused: Not on file    Forced sexual activity: Not on file  Other Topics Concern  . Not on file  Social History Narrative  . Not on file     Physical Exam  Vital Signs and Nursing Notes reviewed Vitals:   04/04/19 1042  BP: (!) 139/91  Pulse: 78  Resp: 16  Temp: 97.8 F (36.6 C)  SpO2: 100%    CONSTITUTIONAL: Well-appearing, NAD NEURO:  Alert and oriented x 3, no focal deficits EYES:  eyes equal and reactive ENT/NECK:  no LAD, no JVD; diffusely poor dentition with multiple missing teeth, multiple caries and degeneration to the left lower molar, tender to palpation CARDIO: Regular rate, well-perfused, normal S1 and S2 PULM:  CTAB no wheezing or rhonchi GI/GU:  normal bowel sounds, non-distended, non-tender MSK/SPINE:  No gross deformities, no edema SKIN:  no rash,  atraumatic PSYCH:  Appropriate speech and behavior  Diagnostic and Interventional Summary    Labs Reviewed - No data to display  No orders to display    Medications - No data to display   Procedures Critical Care  ED Course and Medical Decision Making  I have reviewed the triage vital signs and the nursing notes.  Pertinent labs & imaging results that were available during my care of the patient were reviewed by me and considered in my medical decision making (see below for details).  Consistent with tooth infection, exam reveals no evidence of abscess, normal vital signs, nothing to suggest more significant contiguous infection, appropriate for antibiotics, anti-inflammatories, dentistry follow-up.  After the discussed management above, the patient was determined to be safe for discharge.  The patient was in agreement with this plan and all questions regarding  their care were answered.  ED return precautions were discussed and the patient will return to the ED with any significant worsening of condition.  Elmer SowMichael M. Pilar PlateBero, MD [email or web address]  Final Clinical Impressions(s) / ED Diagnoses     ICD-10-CM   1. Pain, dental  K08.89     ED Discharge Orders         Ordered    penicillin v potassium (VEETID) 500 MG tablet  4 times daily     04/04/19 1114    naproxen (NAPROSYN) 500 MG tablet  2 times daily     04/04/19 1114             Sabas SousBero, Michael M, MD 04/04/19 1117

## 2019-04-04 NOTE — Discharge Instructions (Addendum)
You were evaluated in the Emergency Department and after careful evaluation, we did not find any emergent condition requiring admission or further testing in the hospital.  Your symptoms today seem to be due to a tooth infection.  We will provide you with anti-inflammatories and antibiotics to help with the pain and infection but, as discussed, this tooth likely needs to be extracted.  We have provided resources for dentist in the area.  Please return to the Emergency Department if you experience any worsening of your condition.  We encourage you to follow up with a primary care provider.  Thank you for allowing [place] to be a part of your care.

## 2023-07-26 ENCOUNTER — Emergency Department (HOSPITAL_BASED_OUTPATIENT_CLINIC_OR_DEPARTMENT_OTHER): Payer: Medicaid Other

## 2023-07-26 ENCOUNTER — Emergency Department (HOSPITAL_BASED_OUTPATIENT_CLINIC_OR_DEPARTMENT_OTHER)
Admission: EM | Admit: 2023-07-26 | Discharge: 2023-07-26 | Disposition: A | Discharge status: Home / Self Care | Payer: Medicaid Other | Attending: Emergency Medicine | Admitting: Emergency Medicine

## 2023-07-26 ENCOUNTER — Encounter (HOSPITAL_BASED_OUTPATIENT_CLINIC_OR_DEPARTMENT_OTHER): Payer: Self-pay | Admitting: Emergency Medicine

## 2023-07-26 ENCOUNTER — Other Ambulatory Visit: Payer: Self-pay

## 2023-07-26 DIAGNOSIS — Z87891 Personal history of nicotine dependence: Secondary | ICD-10-CM | POA: Insufficient documentation

## 2023-07-26 DIAGNOSIS — R109 Unspecified abdominal pain: Secondary | ICD-10-CM | POA: Diagnosis present

## 2023-07-26 DIAGNOSIS — K56609 Unspecified intestinal obstruction, unspecified as to partial versus complete obstruction: Secondary | ICD-10-CM

## 2023-07-26 DIAGNOSIS — C799 Secondary malignant neoplasm of unspecified site: Secondary | ICD-10-CM | POA: Diagnosis not present

## 2023-07-26 DIAGNOSIS — K429 Umbilical hernia without obstruction or gangrene: Secondary | ICD-10-CM | POA: Insufficient documentation

## 2023-07-26 DIAGNOSIS — R1033 Periumbilical pain: Secondary | ICD-10-CM

## 2023-07-26 LAB — CBC
HCT: 33.4 % — ABNORMAL LOW (ref 39.0–52.0)
Hemoglobin: 10.1 g/dL — ABNORMAL LOW (ref 13.0–17.0)
MCH: 25.9 pg — ABNORMAL LOW (ref 26.0–34.0)
MCHC: 30.2 g/dL (ref 30.0–36.0)
MCV: 85.6 fL (ref 80.0–100.0)
Platelets: 255 10*3/uL (ref 150–400)
RBC: 3.9 MIL/uL — ABNORMAL LOW (ref 4.22–5.81)
RDW: 17.2 % — ABNORMAL HIGH (ref 11.5–15.5)
WBC: 5.9 10*3/uL (ref 4.0–10.5)
nRBC: 0 % (ref 0.0–0.2)

## 2023-07-26 LAB — URINALYSIS, ROUTINE W REFLEX MICROSCOPIC
Glucose, UA: NEGATIVE mg/dL
Hgb urine dipstick: NEGATIVE
Ketones, ur: NEGATIVE mg/dL
Leukocytes,Ua: NEGATIVE
Nitrite: NEGATIVE
Protein, ur: NEGATIVE mg/dL
Specific Gravity, Urine: 1.025 (ref 1.005–1.030)
pH: 6 (ref 5.0–8.0)

## 2023-07-26 LAB — COMPREHENSIVE METABOLIC PANEL
ALT: 10 U/L (ref 0–44)
AST: 13 U/L — ABNORMAL LOW (ref 15–41)
Albumin: 3.5 g/dL (ref 3.5–5.0)
Alkaline Phosphatase: 107 U/L (ref 38–126)
Anion gap: 8 (ref 5–15)
BUN: 9 mg/dL (ref 6–20)
CO2: 29 mmol/L (ref 22–32)
Calcium: 8.9 mg/dL (ref 8.9–10.3)
Chloride: 97 mmol/L — ABNORMAL LOW (ref 98–111)
Creatinine, Ser: 1.09 mg/dL (ref 0.61–1.24)
GFR, Estimated: >60 mL/min (ref >60)
Glucose, Bld: 97 mg/dL (ref 70–99)
Potassium: 4 mmol/L (ref 3.5–5.1)
Sodium: 134 mmol/L — ABNORMAL LOW (ref 135–145)
Total Bilirubin: 0.6 mg/dL (ref <1.2)
Total Protein: 8.7 g/dL — ABNORMAL HIGH (ref 6.5–8.1)

## 2023-07-26 LAB — LIPASE, BLOOD: Lipase: 29 U/L (ref 11–51)

## 2023-07-26 MED ORDER — LORAZEPAM 2 MG/ML IJ SOLN
1.0000 mg | Freq: Once | INTRAMUSCULAR | Status: DC
  Filled 2023-07-26: qty 1

## 2023-07-26 MED ORDER — IOHEXOL 300 MG/ML  SOLN
125.0000 mL | Freq: Once | INTRAMUSCULAR | Status: AC | PRN
  Administered 2023-07-26: 125 mL via INTRAVENOUS

## 2023-07-26 NOTE — ED Notes (Addendum)
Patient left facility with IV in place after MD told him about his results.

## 2023-07-26 NOTE — ED Provider Notes (Signed)
[place] [facility] Provider Note   CSN: [id number] Arrival date & time: 07/26/23  1708     History  Chief Complaint  Patient presents with   Hernia    umbilical    Carlos Duran is a 52 y.o. male.  HPI     52yo male who does not have PCP or known medical problems (past visits for substance abuse 2018, hx of smoking) who presents with concern for abdominal pain and hernia.  Reports he has had umbilical hernia bulge for 6 years and has been unable to reduce it for the whole time he has had it however it has been causing increasing pain for the last 3 weeks.  Pain located around the umbilical hernia but no other significant abdominal pain, pain worse with palpation. Has not been eating much and for 10 days has not had a bowel movement.    Initially in triage reported no nausea vomiting, then did report he has had nausea and vomiting over the last 3 weeks on my initial history, on reevaluation reports he has not vomited in a while.  He has not felt well and not been eating and has lost at least 30 lb.  No fevers.  He also has a wound on his LLE. He is upset that his wife his helping participate in his history.    Past Medical History:  Diagnosis Date   IV drug abuse (HCC)    Obesity     Past Surgical History:  Procedure Laterality Date   Tendon repair L ring finger      Home Medications Prior to Admission medications   Medication Sig Start Date End Date Taking? Authorizing Provider  naproxen (NAPROSYN) 500 MG tablet Take 1 tablet (500 mg total) by mouth 2 (two) times daily. 04/04/19   Sabas Sous, MD      Allergies    Patient has no known allergies.    Review of Systems   Review of Systems  Physical Exam Updated Vital Signs BP (!) 142/79   Pulse 87   Temp 97.8 F (36.6 C)   Resp 18   SpO2 98%  Physical Exam Vitals and nursing note reviewed.  Constitutional:      General: He is not in acute distress.     Appearance: He is well-developed. He is not diaphoretic.  HENT:     Head: Normocephalic and atraumatic.  Eyes:     Conjunctiva/sclera: Conjunctivae normal.  Cardiovascular:     Rate and Rhythm: Normal rate and regular rhythm.     Heart sounds: Normal heart sounds. No murmur heard.    No friction rub. No gallop.  Pulmonary:     Effort: Pulmonary effort is normal. No respiratory distress.     Breath sounds: Normal breath sounds. No wheezing or rales.  Abdominal:     General: There is distension (mild).     Palpations: Abdomen is soft.     Tenderness: There is abdominal tenderness in the periumbilical area. There is no guarding.     Hernia: A hernia is present. Hernia is present in the umbilical area.  Musculoskeletal:     Cervical back: Normal range of motion.  Skin:    General: Skin is warm and dry.  Neurological:     Mental Status: He is alert and oriented to person, place, and time.     ED Results / Procedures / Treatments   Labs (all labs ordered are listed, but only abnormal results  are displayed) Labs Reviewed  COMPREHENSIVE METABOLIC PANEL - Abnormal; Notable for the following components:      Result Value   Sodium 134 (*)    Chloride 97 (*)    Total Protein 8.7 (*)    AST 13 (*)    All other components within normal limits  CBC - Abnormal; Notable for the following components:   RBC 3.90 (*)    Hemoglobin 10.1 (*)    HCT 33.4 (*)    MCH 25.9 (*)    RDW 17.2 (*)    All other components within normal limits  URINALYSIS, ROUTINE W REFLEX MICROSCOPIC - Abnormal; Notable for the following components:   Bilirubin Urine SMALL (*)    All other components within normal limits  LIPASE, BLOOD  LACTIC ACID, PLASMA  LACTIC ACID, PLASMA    EKG None  Radiology CT ABDOMEN PELVIS W CONTRAST  Result Date: 07/26/2023 CLINICAL DATA:  Abdominal wall hernia. Vomiting. Weight loss * Tracking Code: BO * EXAM: CT ABDOMEN AND PELVIS WITH CONTRAST TECHNIQUE: Multidetector CT  imaging of the abdomen and pelvis was performed using the standard protocol following bolus administration of intravenous contrast. RADIATION DOSE REDUCTION: This exam was performed according to the departmental dose-optimization program which includes automated exposure control, adjustment of the mA and/or kV according to patient size and/or use of iterative reconstruction technique. CONTRAST:  OMNIPAQUE IOHEXOL 300 MG/ML  SOLN COMPARISON:  None Available. FINDINGS: Lower chest: Bilateral lower lobe pulmonary nodules. Example: 7 mm nodule in the RIGHT lower lobe on image 4. 9 mm lingular nodule on image 1. 7 mm azygoesophageal recess nodule image 17. Hepatobiliary: There multiple hypoenhancing round lesions in LEFT and RIGHT hepatic lobe. Approximately 12 lesions. Large lateral segment LEFT hepatic lobe lesion measures 4.5 cm (image 20/series 03/01. This posterior RIGHT hepatic lobe lesion measures 2.0 cm image 25. Central RIGHT hepatic lobe lesion measures 1.8 cm image 17. Gallbladder normal. Pancreas: Common bile duct normal.  Pancreas normal Spleen: Rounded hypoenhancing lesions centrally in the spleen measuring 1.7 cm. Adrenals/urinary tract: Bilateral simple fluid attenuation cyst on postcontrast imaging within the LEFT and RIGHT kidney. No enhancing lesion. Ureters bladder normal. Stomach/Bowel: Stomach duodenum normal. There is a midline ventral hernia contains a long segment of small bowel. The hernia sac measures 5.8 cm. The mouth the hernia measures 3.1 cm (image 63). Approximately 10 cm of small bowel are contained within the hernia. The small bowel in the vicinity of the hernia is dilated and fluid-filled and air-filled. For example loops of bowel in the ventral peritoneal space measure up to 4 cm (153/601). There is a loop of small bowel with this mucosal edema (image 125/601). There is swirling of central mesentery related to this loop of edematous bowel. This in part may relate to hernia and  potentially metastatic adenopathy. There is a single calcification centrally within no lymph node in the central mesentery measuring 2.3 cm image 52. Potential potential closed loop obstruction associated with the ventral hernia with a central mesenteric node. Vascular/Lymphatic: Abdominal aorta normal. Enlarged lymph node LEFT aorta in the retroperitoneum measures 13 mm on image 42. External iliac lymph node on the LEFT measures 11 mm. Prominent inguinal lymph nodes. For example LEFT inguinal node measures 14 mm. Reproductive: Prostate normal Other: Cutaneous nodule in the LEFT abdominal wall flank measures 15 mm on image 65. Small cutaneous nodule in the lower LEFT back on image 35. Musculoskeletal: No aggressive osseous lesion. IMPRESSION: 1. Concern for closed loop small  bowel obstruction associated with a central mesenteric metastatic node with retractile mesenteric pattern. Potential neuroendocrine tumor nodal metastasis as etiology of small bowel obstruction. Alternatively obstruction could relate to the large ventral hernia. 2. Unfortunately patient also has evidence of pulmonary metastasis and hepatic metastasis. No clear source of malignancy in the abdomen pelvis other than mesenteric node described above as well as retroperitoneal and iliac adenopathy. Differential would include occult GI tumor, neuroendocrine tumor, lung cancer metastasis, or melanoma. 3. Potential splenic metastasis additionally. Several cutaneous nodules are indeterminate. Findings conveyed toERIN Ashaya Raftery on 07/26/2023  at21:30. Electronically Signed   By: Genevive Bi M.D.   On: 07/26/2023 21:33    Procedures Procedures    Medications Ordered in ED Medications  LORazepam (ATIVAN) injection 1 mg (1 mg Intravenous Not Given 07/26/23 2109)  iohexol (OMNIPAQUE) 300 MG/ML solution 125 mL (125 mLs Intravenous Contrast Given 07/26/23 2056)    ED Course/ Medical Decision Making/ A&P Clinical Course as of 07/26/23 2357  Mon  Jul 26, 2023  2226 CT ABDOMEN PELVIS W CONTRAST [RT]    Clinical Course User Index [RT] Jeral Pinch, DO                                   52yo male who does not have PCP or known medical problems (past visits for substance abuse 2018, hx of smoking) who presents with concern for abdominal pain, hernia and weight loss.  Differential diagnosis includes incarcerated hernia, obstruction, strangulation, other abscess or lymph node.   Exam concerning for incarcerated hernia (although reports it has never been reducible) ordered CT with contrast.   Labs completed and personally about interpreted by me show no sign of pancreatitis, no hepatitis, does show mild anemia, no leukocytosis, no sign of UTI.  Initially when he was taken to CT, he declined having his CT done given concerns for claustrophobia.  He threatened to leave the emergency department at this time as he did not want to have the CT done.  His wife returned to bedside to discuss the CT, and he was willing to try doing the CT with Ativan as pretreatment.  CT was completed and personally evaluated and interpreted by me and radiology showing concern for a closed-loop small bowel obstruction associated with central mesenteric metastatic node with potential neuroendocrine tumor nodal metastases as etiology of small bowel obstruction, also obstruction could be related to his umbilical hernia.  In addition to these obstructive findings, his CT is also significant for evidence of pulmonary and hepatic metastases with no clear primary malignancy noted on CT. I discussed these findings with patient and his family.  He initially left the emergency department, however his wife brought him back to the emergency department and his daughter, son-in-law and grandson also joined him at the bedside to discuss plan of care.    I initially had called Dr. Magnus Ivan General Surgery at the time that Mr. Rother was declining his CT scan, and again called Dr.  Magnus Ivan of general surgery regarding his CT results.  Dr. Magnus Ivan reviewed his CT, and does not recommend emergent surgery at this time for Mr. Bancroft but does recommend NG tube placement and hospitalist admission.    I discussed these recommendations with Mr. Mccree and his family.  He is declining NG tube placement.  I discussed that we cannot force him to have the NG tube placed, and that the most important thing is that  he is admitted to the hospital.  He is not actively vomiting, and at this time reports he has not had vomiting for some time.  He continues to tell his family that he does not want to be admitted to the hospital, and his family is at bedside encouraging him to continue to pursue medical attention for his obstruction and concern for metastatic cancer. I did initially discuss with him concern that CT shows closed loop obstruction and leaving the hosptial against medical advice carries risk of death and disability and he stated understanding.  He does report he would like to go to [facility] for a second opinion. Paged [facility] regarding possible transfer and discussed concerns on his CT and pushed radiology images over-I spoke with RN who will page the surgeon. When I went to go back into the room to update the patient it appears he eloped and left against medical advice given our previous discussions regarding need for admission and surgical evaluation.           Final Clinical Impression(s) / ED Diagnoses Final diagnoses:  Periumbilical abdominal pain  Periumbilical hernia  Metastatic malignant neoplasm, unspecified site [facility])  SBO (small bowel obstruction) (HCC)    Rx / DC Orders ED Discharge Orders     None         Alvira Monday, MD 07/26/23 2359

## 2023-07-26 NOTE — ED Provider Notes (Signed)
Received patient in turnover from Dr. Dalene Seltzer.  Please see their note for further details of Hx, PE.  Briefly patient is a 52 y.o. male with a Hernia (umbilical) .  Patient found to have a closed loop small bowel obstruction. Thought to be related to neuroendocrine tumor.  [facility].  Awaiting call to transfer center.  I was notified by nursing that the patient had eloped.    Melene Plan, DO 07/27/23 [phone]

## 2023-07-26 NOTE — ED Triage Notes (Signed)
Umbilical hernia , obvious bulging , reports severe pain and needs referral for surgery . Also  reports constipation . Last BM 10 days ago . Poor appetite . Denies NV .

## 2023-08-03 NOTE — Discharge Summary (Addendum)
 [facility] Hospitalist Discharge Summary  Identifying Information:  Carlos Duran 1970/10/26 [id number]  Admit date: 07/28/2023  Discharge date: 08/03/2023  Discharge Service: [facility] Hospitalist  Discharge Attending Physician:No att. providers found  Discharge to: Home  Discharge Diagnoses: Principal Problem:   Acute abdominal pain Active Problems:   Bowel obstruction (CMD)   Mesenteric mass   Incarcerated umbilical hernia    Per H&P HPI: Carlos Duran is a 52 y.o. year old male with no significant past medical history who presents with several weeks of decreased appetite/abdominal pain and weight loss.   Patient reports that he has had an umbilical hernia present for many years however it has worsened/grown over the past month.  He also notes difficulty with eating due to abdominal pain.  This is led to poor appetite over the past 3 to 4 weeks and an approximate 40 pound weight loss over the last month.  Sates that every time he eats he develops abdominal pain.  States that he has otherwise had no issues.  Denies fever/chills.  Denies flushing or diarrhea.  Last bowel movement was yesterday however before that it has been about a week before it had a bowel movement.  He presented to the ED at [facility] 3 days ago with the same symptoms.  CT concerning for small bowel obstruction as well as possible neuroendocrine tumor with metastasis.  NG tube was suggested at that time however patient left the ED.    ED course: Vitals: 97.9, pulse 87, RR 16, BP 125/83, 97% on room air Labs: NA 136, K4.3, creatinine 0.98, calcium 9.3, alk phos 114, lipase 25, WBC 7.3, hemoglobin 10.4, platelets 239 CTA abdomen/pelvis with contrast redemonstrated partial small bowel obstruction, likely due to umbilical hernia as well as irregular spiculated central mesenteric mass with nodal metastasis and redemonstration of pulmonary/hepatic/probable splenic metastasis   Hospital Course:  Partial small bowel obstruction and  nonreducible umbilical hernia Irregular spiculated mesenteric masses concerning for malignancy, differential diagnosis possible neuroendocrine tumor with normal mental status. 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  CT imaging showed finding of partial small bowel obstruction likely due to umbilical hernia.  General surgery was consulted.  Patient underwent exploratory laparotomy, partial small bowel resection, ventral hernia repair, liver biopsy.  After surgery patient was nauseous and unable to take p.o. for 48 hours he was on IV fluids.  His nausea improved, general surgery started him on clear liquid diet and advance to regular diet.  Patient was ambulating and passing gas, eventually had bowel movements on 11/11.  Patient was discharged home to follow-up with general surgery in 2 weeks.  Liver biopsy results will be followed by general surgery. Patient was offered pain medication, he refused pain meds.  He did not complain of much pain following 2 to 3 days after surgery.   Post Discharge Follow Up Issues:  Follow-up with general surgery Follow-up with liver biopsy results   Procedures: None No admission procedures for hospital encounter. _____________________________________________________________________________ Discharge Day Services: BP 149/84 (BP Location: Left arm, Patient Position: Lying)   Pulse 81   Temp 99 F (37.2 C) (Oral)   Resp 18   Ht 1.829 m (6')   Wt 127 kg (281 lb)   SpO2 100%   BMI 38.11 kg/m  Pt seen on the day of discharge and determined appropriate for discharge.  GEN: NAD, lying in bed EYES: EOMI ENT: MMM CV: RRR PULM: CTA B ABD: abdomen in  binder, +BS, soft mildly tender  EXT: No edema    Condition at Discharge: stable  Length of Discharge: I spent 32 mins in the discharge of this  patient. _____________________________________________________________________________ Discharge Medications: Patient Instructions:    Discharge Medications     New Medications      Sig Disp Refill Start End  ondansetron  4 mg disintegrating tablet Commonly known as: ZOFRAN -ODT  Dissolve 1 tablet (4 mg total) on tongue every 8 (eight) hours as needed for nausea or vomiting.  20 tablet  0     psyllium 3.4 gram packet Commonly known as: METAMUCIL  Take 1 packet by mouth daily.  90 packet  0          _____________________________________________________________________________ Pending Test Results (if blank, then none): Pending Labs     Order Current Status   Tissue Exam In process        Most Recent Labs:     Lab Results  Component Value Date   WBC 13.43 (H) 08/01/2023   HGB 9.9 (L) 08/01/2023   HCT 31.2 (L) 08/01/2023   PLT 230 08/01/2023    Lab Results  Component Value Date   CO2 31 08/01/2023   BUN 14 08/01/2023   GLUCOSE 102 (H) 08/01/2023   CREATININE 0.70 08/01/2023   CALCIUM 8.8 08/01/2023   ALBUMIN 3.2 (L) 08/01/2023   AST 9 (L) 08/01/2023   ALT 8 08/01/2023    Lab Results  Component Value Date   NA 137 08/01/2023   K 3.9 08/01/2023   CL 101 08/01/2023   CO2 31 08/01/2023   BUN 14 08/01/2023   CREATININE 0.70 08/01/2023   CALCIUM 8.8 08/01/2023    Lab Results  Component Value Date   BILITOT 0.5 08/01/2023   PROT 7.8 08/01/2023   ALBUMIN 3.2 (L) 08/01/2023   ALT 8 08/01/2023   AST 9 (L) 08/01/2023    Lab Results  Component Value Date   INR 1.1 07/28/2023   Hospital Radiology:  CT Abdomen Pelvis W Contrast  Result Date: 07/28/2023 CLINICAL DATA:  Abdominal pain, acute, nonlocalized EXAM: CT ABDOMEN AND PELVIS WITH CONTRAST TECHNIQUE: Multidetector CT imaging of the abdomen and pelvis was performed using the standard protocol following bolus administration of intravenous contrast. RADIATION DOSE REDUCTION: This exam was  performed according to the departmental dose-optimization program which includes automated exposure control, adjustment of the mA and/or kV according to patient size and/or use of iterative reconstruction technique. CONTRAST:  120 mL iohexoL  (OMNIPAQUE ) 350 mg iodine/mL injection (MDV) 120 mL COMPARISON:  CT scan abdomen and pelvis from 07/26/2023. FINDINGS: Lower chest: There are multiple, subcentimeter sized solid noncalcified nodules throughout bilateral lungs, highly concerning for metastases. The lung bases are otherwise clear. No consolidation or pleural effusion. The heart is normal in size. No pericardial effusion. Hepatobiliary: The liver is normal in size. Non-cirrhotic configuration. There are multiple ill-defined hypoattenuating liver lesions with largest in the left hepatic lobe, segment 2 subcapsular location measuring up to 3.6 x 4.7 cm, compatible with metastases. No intrahepatic or extrahepatic bile duct dilation. There are dependent hyperattenuating areas within the gallbladder, which may represent concentrated bile versus sludge. No imaging evidence of acute cholecystitis. Normal gallbladder wall thickness. No pericholecystic inflammatory changes. Pancreas: Unremarkable. No pancreatic ductal dilatation or surrounding inflammatory changes. Spleen: Size within normal limits. There is a single 1.9 x 2.1 cm hypoattenuating lesion in the central portion of the spleen, incompletely characterized but also concerning for metastases. Adrenals/Urinary Tract: There is a 2.1 x 2.4  cm right adrenal adenoma. Unremarkable left adrenal gland. No suspicious renal mass. There are simple cysts, 1 each in bilateral kidneys. No hydronephrosis. No renal or ureteric calculi. Urinary bladder is under distended, precluding optimal assessment. However, no large mass or stones identified. No perivesical fat stranding. Stomach/Bowel: There are several dilated loops of small bowel measuring up to 4.1 cm in diameter, with  distal most bowel loop herniating into the patient's known pre-existing moderate sized umbilical hernia. The bowel loops in the hernia sac and bowel loop exiting the hernia are nondilated. Distal small bowel loops and colon are nondilated. Unremarkable appendix. No evidence of abnormal bowel wall thickening or inflammatory changes. Vascular/Lymphatic: There is an irregular spiculated mass in the central mesentery measuring up to approximately 1.9 x 3.0 cm. The mass exhibits punctate foci of calcifications in the center. There are several adjacent mesenteric lymph nodes as well as mesenteric fat stranding/trace inter bowel fluid. This is incompletely characterized but differential diagnosis again includes neuroendocrine tumor with nodal metastases. No aneurysmal dilation of the major abdominal arteries. There is small ascites in the dependent pelvis, new since the prior study. Reproductive: Normal size prostate. Symmetric seminal vesicles. Other: There is a moderate sized umbilical hernia containing multiple nondilated small bowel loops. There is a 1.5 x 2.1 cm soft tissue attenuation nodule in the left lower anterior abdominal wall, indeterminate. Musculoskeletal: No suspicious osseous lesions. There are mild multilevel degenerative changes in the visualized spine.   1. Redemonstration of disproportionately dilated small bowel loops up to the point of herniation of bowel loop into the moderate sized umbilical hernia. Findings favor persistent partial small bowel obstruction, likely due to umbilical hernia. Findings are grossly similar to the prior study. 2. Redemonstration of irregular spiculated central mesenteric mass containing punctate dystrophic calcifications. Differential diagnosis again includes neuroendocrine tumor with nodal metastases. Correlate clinically and with tumor markers. 3. Redemonstration of pulmonary, hepatic and probable splenic metastases. Indeterminate soft tissue attenuation nodule in the  left lower anterior abdominal wall. 4. Multiple other nonacute observations, as described above. Electronically Signed   By: Ree Molt M.D.   On: 07/28/2023 17:28   CT Abdomen Pelvis W IV Only  Result Date: 07/26/2023 CLINICAL DATA:  Abdominal wall hernia. Vomiting. Weight loss * Tracking Code: BO * EXAM: CT ABDOMEN AND PELVIS WITH CONTRAST TECHNIQUE: Multidetector CT imaging of the abdomen and pelvis was performed using the standard protocol following bolus administration of intravenous contrast. RADIATION DOSE REDUCTION: This exam was performed according to the departmental dose-optimization program which includes automated exposure control, adjustment of the mA and/or kV according to patient size and/or use of iterative reconstruction technique. CONTRAST:  125mL OMNIPAQUE  IOHEXOL  300 MG/ML  SOLN COMPARISON:  None Available. FINDINGS: Lower chest: Bilateral lower lobe pulmonary nodules. Example: 7 mm nodule in the RIGHT lower lobe on image 4. 9 mm lingular nodule on image 1. 7 mm azygoesophageal recess nodule image 17. Hepatobiliary: There multiple hypoenhancing round lesions in LEFT and RIGHT hepatic lobe. Approximately 12 lesions. Large lateral segment LEFT hepatic lobe lesion measures 4.5 cm (image 20/series 03/01. This posterior RIGHT hepatic lobe lesion measures 2.0 cm image 25. Central RIGHT hepatic lobe lesion measures 1.8 cm image 17. Gallbladder normal. Pancreas: Common bile duct normal.  Pancreas normal Spleen: Rounded hypoenhancing lesions centrally in the spleen measuring 1.7 cm. Adrenals/urinary tract: Bilateral simple fluid attenuation cyst on postcontrast imaging within the LEFT and RIGHT kidney. No enhancing lesion. Ureters bladder normal. Stomach/Bowel: Stomach duodenum normal. There is a midline  ventral hernia contains a long segment of small bowel. The hernia sac measures 5.8 cm. The mouth the hernia measures 3.1 cm (image 63). Approximately 10 cm of small bowel are contained within the  hernia. The small bowel in the vicinity of the hernia is dilated and fluid-filled and air-filled. For example loops of bowel in the ventral peritoneal space measure up to 4 cm (153/601). There is a loop of small bowel with this mucosal edema (image 125/601). There is swirling of central mesentery related to this loop of edematous bowel. This in part may relate to hernia and potentially metastatic adenopathy. There is a single calcification centrally within no lymph node in the central mesentery measuring 2.3 cm image 52. Potential potential closed loop obstruction associated with the ventral hernia with a central mesenteric node. Vascular/Lymphatic: Abdominal aorta normal. Enlarged lymph node LEFT aorta in the retroperitoneum measures 13 mm on image 42. External iliac lymph node on the LEFT measures 11 mm. Prominent inguinal lymph nodes. For example LEFT inguinal node measures 14 mm. Reproductive: Prostate normal Other: Cutaneous nodule in the LEFT abdominal wall flank measures 15 mm on image 65. Small cutaneous nodule in the lower LEFT back on image 35. Musculoskeletal: No aggressive osseous lesion. IMPRESSION: 1. Concern for closed loop small bowel obstruction associated with a central mesenteric metastatic node with retractile mesenteric pattern. Potential neuroendocrine tumor nodal metastasis as etiology of small bowel obstruction. Alternatively obstruction could relate to the large ventral hernia. 2. Unfortunately patient also has evidence of pulmonary metastasis and hepatic metastasis. No clear source of malignancy in the abdomen pelvis other than mesenteric node described above as well as retroperitoneal and iliac adenopathy. Differential would include occult GI tumor, neuroendocrine tumor, lung cancer metastasis, or melanoma. 3. Potential splenic metastasis additionally. Several cutaneous nodules are indeterminate. Findings conveyed toERIN SCHLOSSMAN on 07/26/2023  at21:30. Electronically Signed   By: Jackquline Boxer M.D.   On: 07/26/2023 21:33   _____________________________________________________________________________ Discharge Instructions:      No future appointments.

## 2023-10-21 NOTE — ED Provider Notes (Signed)
 ------------------------------------------------------------------------------- Attestation signed by Charlie Aleene Campion, MD at 10/23/2023 12:31 AM I saw and evaluated the patient. Discussed with DrEckels and agree with their findings and plan and documented in the their note.   Diagnostic Exams: I have personally reviewed the images and agree with the findings as documented   Charlie Aleene Campion, MD  -------------------------------------------------------------------------------  Transfer of Care Note I assumed care of Carlos Duran on 10/22/2023 at 3:28 PM.  Briefly, Carlos Duran is a 53 y.o. male with: No past medical history on file. Past Surgical History:  Procedure Laterality Date  . EXPLORATORY LAPAROTOMY N/A 07/29/2023   EXPLORATORY LAPAROTOMY WITH SMALL BOWEL RESECTION X 2 performed by Chandler Lace, MD at [facility] OR  . VENTRAL HERNIA REPAIR N/A 07/29/2023   REPAIR HERNIA VENTRAL WITHOUT MESH performed by Chandler Lace, MD at [facility] OR    P/w Hoy Hopping, PA-C  Brief summary: Patient with new malignant neuroendocrine cancer presenting for concern for strokelike symptoms due to right facial droop, pronator drift, confusion.  Last known normal unknown.  Stroke workup/altered mental status workup started by previous team.    Plan at the time of handoff: Stroke workup unremarkable, dispo pending completion of altered mental status workup.   ED Course as of 10/22/23 0932  Thu Oct 21, 2023  1504 S, 79M, new malignant neuroendocrine cancer, lots of recent ex-laps, code stroke w ride side deficits FU infectious w/u [LS]  1504 Stable, new malignant neuroendocrine cx, R facial droop, pronator drift, confusion. Infectious/AMS -  [JE]    ED Course User Index [JE] Venetia Krystal Dunker, MD [LS] Jerilynn Bevely Later, MD    Please refer to the original provider's note for additional information regarding the care of Carlos Duran.  Reassessment: I personally  reassessed the patient:  Vital Signs:  The most current vitals were Visit Vitals BP 153/83  Pulse 80  Resp 15  SpO2 97%    Hemodynamics:  The patient is hemodynamically stable. Mental Status:  The patient is alert  Additional MDM/ED Course: Initial evaluation of patient, lying comfortably in bed in no acute distress.  Neurologic exam unremarkable for any deficits.  Cranial nerves II through XII grossly intact.  5 out of 5 strength in bilateral upper and lower extremities.  Sensation grossly intact.  GCS 15.  Lungs clear to auscultation.  No other pertinent exam findings at this time.  Patient's only complaint on evaluation is pain associated with his cancer.  Patient states that he has a history of IV drug use, and had stopped prior to recent diagnosis of cancer.  Patient last used this past Monday.  States he uses approximately 0.2 g of heroin daily.  Will administer dose of Toradol  for associated pain.  Remaining of patient altered mental status workup unremarkable.  Ethanol less than 10.  Ammonia 34.  EKG unremarkable for any ischemic changes, abnormal interval length to suggest arrhythmia, no signs of Brugada, no delta wave to suggest WPW, no hyperacute Q waves to suggest HOCM.  Due to patient unremarkable neurologic exam, GCS 15, and unremarkable altered mental status workup did not believe there is any emergent cause of patient presentation at this time.  Despite IV drug use history, no indication at this time to pursue further infectious workup as patient afebrile, without leukocytosis, or any obvious signs of infection on exam or on imaging.  Low suspicion for any sort of endocarditis, septic emboli, meningitis, spinal abscess, or any other infectious process.  Patient pain improved  with Toradol  at bedside.  Discussed with him that he needed to follow with his oncologist outpatient to help achieve pain control.  Patient vitals remained stable throughout.  Patient deemed stable for  discharge.  With strict return precautions and encourage patient to follow-up with PCP outpatient.  Patient agreeable to plan.  Patient discharged home.   1. Confusion Acute  2. Generalized abdominal pain Active     ED Disposition     ED Disposition  Discharge   Condition  Stable   Comment  --          Patient seen in conjunction with Dr. Lucilla  Electronically signed by:  Venetia Krystal Dunker, MD 10/21/2023 3:28 PM

## 2023-12-09 NOTE — Progress Notes (Signed)
 Hematology/Oncology Follow-up Visit  Patient Name:  Carlos Duran Date of Birth:  05/09/1971 Date of Encounter:  12/09/2023  Referring Provider:  No ref. provider found,   PCP:  PCP Needed PPI  Diagnoses: 1.  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  Full staging underway.  2.  History of heroin use.  3.  Tobacco abuse.    Hematologic/Oncologic History: Carlos Duran is a 53 y.o. who is seen in consultation at the request of No ref. 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  Carlos Duran has a past medical history significant for tobacco abuse, heroin use, and abdominal pain and unintentional weight loss that began sometime in September 2024.  He endorsed a 6 to 7-year history of an umbilical hernia.  He presented to [facility] health on 07/26/2023 because of worsening abdominal pain, obstipation and anorexia.  According to the ER medical record, he had noted what he thought was a hernia for about 6 years and that he had been unable to reduce it the entire time.  For the prior 3 weeks this has been getting more painful.  CT of the abdomen and pelvis with contrast on 07/26/2023 demonstrated a possible closed loop small bowel obstruction associated with a central mesenteric metastatic node with retractile mesenteric pattern.  Radiology felt that this could represent a neuroendocrine tumor nodal metastasis as etiology of small bowel obstruction.  There was also evidence of possible pulmonary metastasis and hepatic metastasis.  After hearing of these results, he left the emergency department.  2 days later on 07/28/2023, he presented to [facility] with the same complaints.  He had reported to the provider that after hearing of the results,  he became scared and anxious related to family members having bad outcomes and hospitals so he left.  CT of the abdomen and pelvis with contrast revealed multiple ill-defined hypoattenuating liver lesions with largest in the left hepatic lobe segment 2 measuring up to 3.6 x 4.7 cm.  There was a single 1.9 x 2.1 cm hypoattenuating lesion in the central portion of the spleen.  A 2.1 x 2.4 cm right adrenal adenoma was seen.  Several dilated loops of small bowel were seen measuring up to 4.1 cm in diameter with distalmost bowel loop herniating into the pre-existing moderate-sized umbilical hernia.  An irregular spiculated mass was seen in the central mesentery measuring up to approximately 1.9 x 3.0 cm.  There were several adjacent mesenteric lymph nodes as well as mesenteric fat stranding/trace interbowel fluid.  There was a moderate-sized umbilical hernia containing multiple nondilated small bowel loops with a 1.5 x 2.1 cm soft tissue attenuation nodule in the left lower anterior abdominal wall which was indeterminate.  Multiple subcentimeter sized solid noncalcified nodules throughout the bilateral lungs were seen, highly concerning for metastasis.  He was seen and evaluated by general surgery.  On 07/29/2023, he was taken to the operating room where he underwent exploratory laparotomy with lysis of adhesions, small bowel resection, and wedge biopsy of the liver.  According to the operative report, there was evidence of a dense mesenteric mass associated with the area just proximal to the point of obstruction.  Small bowel resection was performed involving both the initial point of obstruction as well as incorporating the mesenteric mass to avoid  future obstruction and assist with diagnosis.  Upon entering the hernia sac, the underlying small bowel was found to be densely adherent to the overlying hernia sac.  Further inspection of the abdomen revealed concerning nodules in the liver suggestive of metastatic  disease.  A single nodule was wedged from the anterior surface.  Upon recovery, he was discharged to home on 08/02/2023.  Pathology from the small bowel resection revealed low-grade neuroendocrine tumor, grade 1.  The tumor invaded through the wall and visceral peritoneum.  In total 5 of 16 lymph nodes with metastatic low-grade neuroendocrine tumor were identified.  The hernia repair demonstrated skin and underlying fibroadipose tissue with focal low-grade neuroendocrine tumor.  The liver wedge biopsy revealed metastatic low-grade neuroendocrine tumor.  Ki-67 stains less than 3% of the tumor cells.  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  This is a delayed follow-up.  He did not follow-up with me earlier in the year because he was in rehab/detox from fentanyl.  He has not used for at least 2 months.  He is attending group meetings and is committed to sobriety.  He has intermittent left lower quadrant abdominal pain that is crampy and intermittent in nature.  It does not happen every day but if it does happen a day, it will come and go.  He has not really tried anything.  It does not seem to be associated with bowel movements.  ECOG performance status 1.  Review of Systems: All other systems are negative.  Medications:   Current Outpatient Medications:  .  hydrocortisone-nystatin-diphenhydramine (MAGIC MOUTHWASH) suspension, Take 10 mL by mouth 4 (four) times a day as needed (For mouth and throat pain)., Disp: 480 mL, Rfl: 0 .  traZODone (DESYREL) 100 mg tablet, Take 0.5 tablets (50 mg total) by mouth at bedtime., Disp: 45 tablet, Rfl: 3 .  psyllium (METAMUCIL) 3.4 gram packet, Take 1 packet by mouth daily., Disp: 90 packet, Rfl: 0  Past medical history, allergies, current medications, social history and family history were reviewed and updated when appropriate.  Objective:  BP 115/73   Pulse 84    Temp 98 F (36.7 C) (Oral)   Resp 16   Wt 119 kg (262 lb 6.4 oz)   SpO2 100%   BMI 35.59 kg/m  General: Nontoxic-appearing African-American male, no acute distress. HEENT: No scleral icterus or injection.  Mucous membranes moist. Lymphatic/Immunologic:  No cervical, supraclavicular or axillary adenopathy. Cardiovascular:  Regular rate and rhythm. Respiratory:  Chest clear to auscultation bilaterally. No respiratory distress. The patient speaks in full sentences. No accessory muscle use noted. Gastrointestinal: No abdominal distention noted.  No rebound or guarding tenderness. Musculoskeletal:  No bony pain or tenderness. No tenosynovitis or joint effusions noted. Extremities:  No edema or suspicious rashes.  No cyanosis or clubbing. Skin:  No pathologic appearing petechiae or bruising noted.  Wt Readings from Last 5 Encounters:  12/09/23 119 kg (262 lb 6.4 oz)  08/24/23 124 kg (273 lb 6.4 oz)  08/18/23 127 kg (281 lb)  07/28/23 127 kg (281 lb)    Results:   Results for orders placed or performed during the hospital encounter of 10/21/23  Prothrombin Time (PT) with INR   Collection Time: 10/21/23 12:56 PM  Result Value Ref Range   Prothrombin Time (PT) 11.5 8.9 - 12.1 seconds   International Normalized Ratio (INR) 1.1 <5.0  Comprehensive Metabolic Panel   Collection Time:  10/21/23 12:56 PM  Result Value Ref Range   Sodium 136 136 - 145 mmol/L   Potassium 3.5 3.5 - 5.1 mmol/L   Chloride 97 (L) 98 - 107 mmol/L   CO2 27 21 - 31 mmol/L   Anion Gap 12 6 - 14 mmol/L   Glucose, Random 96 70 - 99 mg/dL   Blood Urea Nitrogen (BUN) 10 7 - 25 mg/dL   Creatinine 9.11 9.29 - 1.30 mg/dL   eGFR >09 >40 fO/fpw/8.26f7   Albumin 4.3 3.5 - 5.7 g/dL   Total Protein 89.6 (H) 6.4 - 8.9 g/dL   Bilirubin, Total 0.6 0.3 - 1.0 mg/dL   Alkaline Phosphatase (ALP) 147 (H) 34 - 104 U/L   Aspartate Aminotransferase (AST) 14 13 - 39 U/L   Alanine Aminotransferase (ALT) 9 7 - 52 U/L   Calcium 9.9 8.6  - 10.3 mg/dL   BUN/Creatinine Ratio    aPTT   Collection Time: 10/21/23 12:56 PM  Result Value Ref Range   Activated Partial Thromboplastin Time (aPTT) 24.8 <=30.0 seconds  CBC with Differential   Collection Time: 10/21/23 12:56 PM  Result Value Ref Range   WBC 11.30 (H) 4.40 - 11.00 10*3/uL   RBC 4.86 4.50 - 5.90 10*6/uL   Hemoglobin 11.8 (L) 14.0 - 17.5 g/dL   Hematocrit 63.0 (L) 58.4 - 50.4 %   Mean Corpuscular Volume (MCV) 75.8 (L) 80.0 - 96.0 fL   Mean Corpuscular Hemoglobin (MCH) 24.2 (L) 27.5 - 33.2 pg   Mean Corpuscular Hemoglobin Conc (MCHC) 32.0 (L) 33.0 - 37.0 g/dL   Red Cell Distribution Width (RDW) 18.8 (H) 12.3 - 17.0 %   Platelet Count (PLT) 322 150 - 450 10*3/uL   Mean Platelet Volume (MPV) 8.8 6.8 - 10.2 fL   Neutrophils % 67 %   Lymphocytes % 22 %   Monocytes % 10 %   Eosinophils % 1 %   Basophils % 1 %   nRBC % 0 %   Neutrophils Absolute 7.60 1.80 - 7.80 10*3/uL   Lymphocytes # 2.50 1.00 - 4.80 10*3/uL   Monocytes # 1.10 (H) 0.00 - 0.80 10*3/uL   Eosinophils # 0.10 0.00 - 0.50 10*3/uL   Basophils # 0.10 0.00 - 0.20 10*3/uL   nRBC Absolute 0.00 <=0.00 10*3/uL  Ethanol   Collection Time: 10/21/23  2:40 PM  Result Value Ref Range   Ethanol <10 <10 mg/dL  Ammonia   Collection Time: 10/21/23  2:40 PM  Result Value Ref Range   Ammonia 34 18 - 72 umol/L  Urine Culture   Collection Time: 10/21/23  2:50 PM   Specimen: Clean Catch; Urine  Result Value Ref Range   Urine Culture No Growth   Urinalysis with Reflex to Microscopic   Collection Time: 10/21/23  2:50 PM  Result Value Ref Range   Color, Urine Yellow Yellow   Clarity, Urine Clear Clear   Specific Gravity, Urine >1.050 (H) 1.005 - 1.025   pH, Urine 6.0 5.0 - 8.0   Protein, Urine 20 (A) Negative mg/dL   Glucose, Urine Negative Negative mg/dL   Ketones, Urine Trace (A) Negative mg/dL   Bilirubin, Urine Negative Negative   Blood, Urine Negative Negative   Nitrite, Urine Negative Negative    Leukocyte Esterase, Urine Negative Negative, 25   Urobilinogen, Urine Normal <2.0 mg/dL  Drug Screen, 10 Panel, Urine   Collection Time: 10/21/23  2:50 PM  Result Value Ref Range   Amphetamines Screen, Urine Negative Negative   Barbiturates  Screen, Urine Negative Negative   Benzodiazepines Screen, Urine Negative Negative   Buprenorphine Screen, Urine Positive (A) Negative   Cocaine Screen, Urine Negative Negative   Methadone Screen, Urine Negative Negative   Opiates Screen, Urine Negative Negative   Fentanyl Screen, Urine Positive (A) Negative   Marijuana (THC) Screen, Urine Negative Negative   Oxycodone  Screen, Urine Negative Negative   Creatinine, Urine 210 >=20 mg/dL  SARS-CoV-2 Influenza A and B, Nucleic Acid Test Symptomatic   Collection Time: 10/21/23  4:02 PM   Specimen: Nasopharynx; Swab  Result Value Ref Range   SARS-CoV-2, Nucleic Acid Test - Rapid Not Detected Not Detected   Influenza A, Nucleic Acid Test - Rapid Not Detected Not Detected   Influenza B, Nucleic Acid Test - Rapid Not Detected Not Detected    Radiology:   PET/CT Skull Base to Mid Thigh Narrative: CLINICAL DATA:  Neuroendocrine tumor.  0.0  EXAM: NUCLEAR MEDICINE PET SKULL BASE TO THIGH  TECHNIQUE: 4.0 mCi gallium 68 DOTATATE was injected intravenously. Full-ring PET imaging was performed from the skull base to thigh after the radiotracer. CT data was obtained and used for attenuation correction and anatomic localization.  COMPARISON:  CT abdomen pelvis 07/26/2019  FINDINGS: NECK  No radiotracer activity in neck lymph nodes.  Incidental CT findings: None  CHEST  Bilateral pulmonary nodules again noted. These nodules have mild to moderate radiotracer activity. Example nodule in the lateral aspect of the LEFT upper lobe measuring 10 mm on image 64 with SUV max equal 5.7. More intense nodule centrally in the LEFT hilum measures 19 mm with SUV max equal 12.2  Radiotracer avid RIGHT  paratracheal node measures 13 mm SUV max equal 15.6 (image 48.  Incidental CT finding:None  ABDOMEN/PELVIS  Ill-defined intense radiotracer avid mass in the posterior RIGHT hepatic lobe measures approximately 6.5 cm with SUV max equal 38.9 (image 95). Background liver radiotracer activity equal 13.8. Several smaller lesions are present in the anterior LEFT hepatic lobe on image 95 SUV max equal 17 and 22  Interval resection of the central mesenteric mass. Interval partial small bowel resection. No abnormal radiotracer activity associated with the bowel. There is a radiotracer avid lymph node LEFT of the aorta the level the kidneys measuring 12 mm with SUV max equal 31 on image 102.  Subcutaneous nodule in the LEFT abdominal wall measuring 19 mm has intense radiotracer activity SUV max equal 20.  Mild radiotracer activity associated with the inguinal nodes nonspecific  There are additional scattered subcutaneous nodules in the flanks and back with radiotracer activity. Example insert 10 is nodule superficial to the LEFT paraspinal musculature on image 92 SUV max equal 14.  Physiologic activity noted in the liver, spleen, adrenal glands and kidneys.  Incidental CT findings:None  SKELETON  No focal activity to suggest skeletal metastasis.  Incidental CT findings:None Impression: 1. Interval resection of central mesenteric mass and partial small bowel resection. No evidence of local recurrence. 2. Radiotracer avid liver metastasis consistent well differentiated neuroendocrine tumor metastasis. 3. Bilateral pulmonary nodules and hilar nodules and mediastinal nodes with radiotracer avidity. Findings consistent with neuroendocrine tumor pulmonary metastasis. 4. Radiotracer avid subcutaneous nodules in the flanks, back and LEFT abdominal wall consistent with metastatic neuroendocrine tumor disease. 5. No evidence of skeletal metastasis.  Electronically Signed   By:  Jackquline Boxer M.D.   On: 12/04/2023 20:37    Assessment and Plan:  1.  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  There is radiographic evidence of metastatic disease to the liver as well as bilateral lungs and hilar lymph nodes.  He also has radiotracer avid subcutaneous nodules in the flanks, back and left abdominal wall that are consistent with metastatic neuroendocrine tumor.  I was able to pull up the imaging and I showed Carlos Duran the actual images.  I told him I would recommend initiation of Sandostatin LAR 20 mg every 4 weeks.  Written information was provided.  We reviewed the data supporting the use of Sandostatin LAR in patients with metastatic neuroendocrine carcinoma.  We talked about our goals of treatment which are to control his disease, delay progression and by doing so, improve survival.  There would be no role for surgery at this time.  After reviewing the risk, benefits and alternatives, he stated that he would like some time to think about this.  I have an order written for Sandostatin LAR and it has been approved by insurance.  He said that he would call in the next day or so to let me know his decision.  I will plan to tentatively see him back in 3 weeks in the hopes that we will be able to get his first injection done in the next 5 to 7 days and that will be a 1 to 2-week follow-up toxicity visit.  We will get labs at that time as well.  Edema best offer support today.  Questions were answered today to the best of my ability.  In the interim, he is encouraged to call if questions, concerns, or problems arise.  In total, 45 minutes of time was spent on this encounter, greater than 50% of which was spent face-to-face with Carlos Duran.  Non-face-to-face activities may include but are not necessarily limited to reviewing medical records, entering orders, reviewing and signing treatment/chemotherapy  orders, reviewing and interpreting imaging studies and labs, as well as medical documentation.  This document was created using the aid of voice recognition Dragon dictation software, please excuse any sound alike substitutions, typographic or transcription errors.  Efforts have been made to correct these dictation errors, however some may persist, and this does not reflect the standard of medical care.  If there are any questions please do not hesitate to contact me for clarification.

## 2023-12-22 NOTE — ED Notes (Addendum)
 Call report given to [facility], LPN at [facility] [phone]. They will arrange transportation.     Asberry Galla, RN 12/22/23 1249

## 2023-12-22 NOTE — ED Provider Notes (Signed)
 [facility] Emergency Medicine Care Note   Chief Complaint  Patient presents with  . Abdominal Pain  . Nausea  . Vomiting    History   Patient is a pleasant 53 year old coming in today with concern for abdominal pain nausea vomiting.  Reports he uses fentanyl as well as alcohol.  Last use was Sunday.  Is currently at [facility] facility and they sent him here for nausea vomiting.  No past medical history on file. Past Surgical History:  Procedure Laterality Date  . EXPLORATORY LAPAROTOMY N/A 07/29/2023   EXPLORATORY LAPAROTOMY WITH SMALL BOWEL RESECTION X 2 performed by Chandler Lace, MD at [facility] OR  . VENTRAL HERNIA REPAIR N/A 07/29/2023   REPAIR HERNIA VENTRAL WITHOUT MESH performed by Chandler Lace, MD at [facility] OR   No family history on file. Social History   Tobacco Use  . Smoking status: Every Day  . Smokeless tobacco: Never      Physical Exam   Physical Exam ED Triage Vitals  Temp 12/22/23 1018 99.1 F (37.3 C)  Heart Rate 12/22/23 1018 79  Resp 12/22/23 1018 20  BP 12/22/23 1018 158/75  MAP (mmHg) 12/22/23 1030 118  SpO2 12/22/23 1018 98 %  O2 Device 12/22/23 1018 None (Room air)  O2 Flow Rate (L/min) --   Weight 12/22/23 1018 105 kg (232 lb 6.4 oz)   Physical Exam Vitals and nursing note reviewed.  HENT:     Head: Normocephalic.     Nose: Nose normal.  Eyes:     Pupils: Pupils are equal, round, and reactive to light.  Cardiovascular:     Rate and Rhythm: Normal rate and regular rhythm.  Pulmonary:     Effort: Pulmonary effort is normal. No respiratory distress.  Abdominal:     General: There is no distension.     Tenderness: There is abdominal tenderness (Soreness generalized, no foccal tenderness).     Comments: Dry heaving in the room.  Only sputum is in his emesis bag  Musculoskeletal:        General: No deformity.  Neurological:     General: No focal deficit present.     Mental Status: He is alert.   Psychiatric:        Mood and Affect: Mood normal.           Procedures Performed   Procedures              Discharge Medications Recommended   New Prescriptions   DICLOFENAC (VOLTAREN) 75 MG EC TABLET    Take 1 tablet (75 mg total) by mouth 2 (two) times a day for 7 days.   ONDANSETRON  (ZOFRAN -ODT) 4 MG DISINTEGRATING TABLET    Dissolve 1 tablet (4 mg total) on tongue every 8 (eight) hours as needed for nausea or vomiting.     ED Course and Medical Decision Making   Medical Decision Making Problems Addressed: Abdominal discomfort: complicated acute illness or injury Nausea and vomiting, unspecified vomiting type: complicated acute illness or injury  Amount and/or Complexity of Data Reviewed Independent Historian: EMS External Data Reviewed: notes.    Details: Outpatient notes reviewed.  Does have history of neuroendocrine cancer with metastases. Labs: ordered. Decision-making details documented in ED Course. Radiology: ordered. Decision-making details documented in ED Course. ECG/medicine tests: ordered and independent interpretation performed. Decision-making details documented in ED Course.  Risk OTC drugs. Prescription drug management. Decision regarding hospitalization. Diagnosis or treatment significantly limited by social determinants of health.  Patient is a 53 year old coming today via EMS with concern for abdominal discomfort nausea vomiting in the setting of possible withdrawal of opiates.  He is nontoxic my exam, interactive.  He is lying in bed.  No tachycardia.  Will occasionally dry heaves but nothing is coming up.  Exam as above.  Will proceed with labs, imaging  Differential includes AKI, dehydration, electrode abnormality, obstruction, perforation, opiate withdrawal.  I doubt significant alcohol withdrawal given exam history of vital signs.  Cardiac telemetry/monitor strip ordered reviewed and interpreted personally by myself to assess for  arrhythmia and conduction abnormality given the patient's symptoms.  It was reviewed and interpreted by me and shows sinus rhythm rate 74  Labs reviewed and independently interpreted personally by me show reassuring electrolytes.  Lipase normal making pancreatitis unlikely.  He has no white count elevation or emergent anemia  Upon reassessment, patient resting in bed.  Exam benign.  I did order and recommend a CT scan of his abdomen especially given his history but patient refused.  Patient was requesting something stronger for pain as a prescription.  I did relay this would best be managed by primary care and oncology given his diagnosis and he is also currently in rehab for fentanyl use and alcohol abuse and his last use of both of these was only a couple of days ago.  Do not believe narcotic medication is appropriate or safe for this patient at this time.  He voiced understanding.  I will send him home on diclofenac and Zofran  with plan for outpatient follow-up.  He has no active signs of emergent withdrawal at this time and is refusing any further imaging therefore I believe discharge is reasonable.  --Clinical Complexity  Patient's presentation is most consistent withacute presentation with potential threat to life or bodily function.  Patient does have comorbidities/underlying diseases, namely cancer, which makes the patient's presentation today of nausea vomiting higher risk, increases risk of complications and morbidity/mortality of patient management, and ultimately increases complexity of their visit today.  Multiple problems were addressed during today's visit as outlined above.   Admission/Hospitalization was considered.  However he is tolerating p.o. and pain is well-controlled..  Evaluation and diagnostic testing in the emergency department does not suggest an emergent condition requiring admission or immediate intervention beyond what has been performed at this time.  The patient is safe  for discharge and has been instructed to return immediately for worsening symptoms, change in symptoms or any other concerns.  Medications Delivered During ED Care   Medications  ondansetron  (ZOFRAN -ODT) disintegrating tablet 4 mg (4 mg oral Given 12/22/23 1100)  cloNIDine  (CATAPRES ) tablet 0.2 mg (0.2 mg oral Given 12/22/23 1100)  ketorolac  (TORADOL ) injection 15 mg (15 mg intramuscular Given 12/22/23 1237)  acetaminophen  (TYLENOL ) tablet 1,000 mg (1,000 mg oral Given 12/22/23 1237)      ED Disposition and Diagnosis       1. Nausea and vomiting, unspecified vomiting type      2. Abdominal discomfort        Discharge    Prentice Russel, MD Emergency Medicine [facility]

## 2023-12-22 NOTE — ED Notes (Signed)
 Multiple staff members from lab and multiple RN s have stuck pt, unable to obtain labs. MD made aware.    Landis Agent, RN 12/22/23 1131

## 2023-12-22 NOTE — ED Notes (Signed)
 Pt refusing CT scan. MD at beside speaking to pt.    Landis Agent, RN 12/22/23 1230

## 2023-12-22 NOTE — ED Triage Notes (Addendum)
 Pt BIB [facility]. Currently admitted to [facility] 12/20/23. Started on Suboxone last dose this am. EMD was called for abd pain, N/V/ weakness. EMS reports that pt hasn't vomited in their care, just spitting.    [facility] concerned for withdraws for alcohol and fentayl.

## 2024-02-09 ENCOUNTER — Emergency Department (HOSPITAL_BASED_OUTPATIENT_CLINIC_OR_DEPARTMENT_OTHER)
Admission: EM | Admit: 2024-02-09 | Discharge: 2024-02-09 | Disposition: A | Discharge status: Home / Self Care | Payer: MEDICAID | Attending: Emergency Medicine | Admitting: Emergency Medicine

## 2024-02-09 ENCOUNTER — Encounter (HOSPITAL_BASED_OUTPATIENT_CLINIC_OR_DEPARTMENT_OTHER): Payer: Self-pay | Admitting: Emergency Medicine

## 2024-02-09 ENCOUNTER — Other Ambulatory Visit: Payer: Self-pay

## 2024-02-09 DIAGNOSIS — F17210 Nicotine dependence, cigarettes, uncomplicated: Secondary | ICD-10-CM | POA: Insufficient documentation

## 2024-02-09 DIAGNOSIS — H1031 Unspecified acute conjunctivitis, right eye: Secondary | ICD-10-CM | POA: Diagnosis not present

## 2024-02-09 DIAGNOSIS — H5789 Other specified disorders of eye and adnexa: Secondary | ICD-10-CM | POA: Diagnosis present

## 2024-02-09 MED ORDER — ERYTHROMYCIN 5 MG/GM OP OINT: TOPICAL_OINTMENT | Freq: Four times a day (QID) | OPHTHALMIC | 0 refills | Status: AC

## 2024-02-09 MED ORDER — ERYTHROMYCIN 5 MG/GM OP OINT: TOPICAL_OINTMENT | OPHTHALMIC | 0 refills | Status: DC

## 2024-02-09 NOTE — Discharge Instructions (Addendum)
 We evaluated you for your eye redness.  Your examination shows that you have a condition called conjunctivitis.  We have prescribed you some antibacterial ointment.  Please use this 4 times a day.  We would like you to follow-up with the ophthalmologist, Dr. Alveda Aures.  If you have any worsening symptoms such as loss of vision or change in your vision, eye pain, symptoms do not improve, fevers, or any other concerning symptoms, please return so we can recheck your eye.

## 2024-02-09 NOTE — ED Triage Notes (Signed)
 Obvious right eye redness , drainage and swelling , symptoms started 2 days ago .

## 2024-02-09 NOTE — ED Provider Notes (Signed)
 [facility] Provider Note  CSN: [id number] Arrival date & time: 02/09/24 1541  Chief Complaint(s) Eye Problem (right)  HPI Carlos Duran is a 53 y.o. male without significant past medical history presenting to the emergency department with eye redness.  Patient reports that he has had around 3 days of redness to the right eye.  He might of had a little bit of discomfort initially but reports that it is painless.  He has had some crusting and drainage.  He notes that he feels that his eyelid is little swollen and when it is drooping he has a change in his vision but if he opens his eyes and lifts his eyelid his vision is normal.  No trauma to the eye.  No fevers or chills.  No pain with moving the eye. Does have distant hx of IVDU but none recently    Past Medical History Past Medical History:  Diagnosis Date   IV drug abuse (HCC)    Obesity    There are no active problems to display for this patient.  Home Medication(s) Prior to Admission medications   Medication Sig Start Date End Date Taking? Authorizing Provider  erythromycin ophthalmic ointment Place into the right eye 4 (four) times daily. Place a 1/2 inch ribbon of ointment into the lower eyelid. 02/09/24   Mordecai Applebaum, MD  naproxen  (NAPROSYN ) 500 MG tablet Take 1 tablet (500 mg total) by mouth 2 (two) times daily. 04/04/19   Edson Graces, MD                                                                                                                                    Past Surgical History Past Surgical History:  Procedure Laterality Date   Tendon repair L ring finger     Family History Family History  Problem Relation Age of Onset   Hypertension Sister     Social History Social History   Tobacco Use   Smoking status: Every Day    Current packs/day: 0.50    Types: Cigarettes   Smokeless tobacco: Never  Substance Use Topics   Alcohol use: No   Drug use: Yes     Comment: heroin   Allergies Patient has no known allergies.  Review of Systems Review of Systems  All other systems reviewed and are negative.   Physical Exam Vital Signs  I have reviewed the triage vital signs BP (!) 137/92   Pulse 80   Temp 98 F (36.7 C) (Oral)   Resp 16   Wt 117.9 kg   SpO2 100%   BMI 35.26 kg/m  Physical Exam Vitals and nursing note reviewed.  Constitutional:      General: He is not in acute distress.    Appearance: Normal appearance.  HENT:     Head: Normocephalic and atraumatic.     Mouth/Throat:     Mouth:  Mucous membranes are moist.  Eyes:     Comments: Left eye normal conjunctiva, right eye injected .  Scant drainage right eye.  Lids/lashes with mild edema to the right upper and lower lids.  No proptosis.  Extraocular movements full and painless.  Visual fields intact.  No tenderness to the eye.  No hyphema/hypopyon  Cardiovascular:     Rate and Rhythm: Normal rate.  Pulmonary:     Effort: Pulmonary effort is normal. No respiratory distress.  Abdominal:     General: Abdomen is flat.  Skin:    General: Skin is warm and dry.     Capillary Refill: Capillary refill takes less than 2 seconds.  Neurological:     General: No focal deficit present.     Mental Status: He is alert. Mental status is at baseline.  Psychiatric:        Mood and Affect: Mood normal.        Behavior: Behavior normal.     ED Results and Treatments Labs (all labs ordered are listed, but only abnormal results are displayed) Labs Reviewed - No data to display                                                                                                                        Radiology No results found.  Pertinent labs & imaging results that were available during my care of the patient were reviewed by me and considered in my medical decision making (see MDM for details).  Medications Ordered in ED Medications - No data to display                                                                                                                                    Procedures Procedures  (including critical care time)  Medical Decision Making / ED Course   MDM:  53 year old presenting to the emergency department with eye redness.  On exam, there is redness to the right eye.  He has drainage.  Suspect conjunctivitis.  He did report some vague visual disturbance although on further history this is really when his upper lid is covering part of his pupil, when he lifts that his vision is normal.  Visual acuity was reviewed and equal in both eyes.  Very low concern for dangerous process such as endophthalmitis, periorbital cellulitis, orbital cellulitis, uveitis, scleritis or iritis.  Recommended erythromycin ointment, 4 times daily.  Recommended follow-up with ophthalmology if symptoms fail to improve, and strict ER precautions for any worsening symptoms.       Medicines ordered and prescription drug management: Meds ordered this encounter  Medications   DISCONTD: erythromycin ophthalmic ointment    Sig: Place a 1/2 inch ribbon of ointment into the lower eyelid.    Dispense:  3.5 g    Refill:  0   erythromycin ophthalmic ointment    Sig: Place into the right eye 4 (four) times daily. Place a 1/2 inch ribbon of ointment into the lower eyelid.    Dispense:  3.5 g    Refill:  0    -I have reviewed the patients home medicines and have made adjustments as needed  Social Determinants of Health:  Diagnosis or treatment significantly limited by social determinants of health: obesity   Reevaluation: After the interventions noted above, I reevaluated the patient and found that their symptoms have improved  Co morbidities that complicate the patient evaluation  Past Medical History:  Diagnosis Date   IV drug abuse (HCC)    Obesity       Dispostion: Disposition decision including need for hospitalization was considered, and patient discharged  from emergency department.    Final Clinical Impression(s) / ED Diagnoses Final diagnoses:  Acute conjunctivitis of right eye, unspecified acute conjunctivitis type     This chart was dictated using voice recognition software.  Despite best efforts to proofread,  errors can occur which can change the documentation meaning.    Mordecai Applebaum, MD 02/09/24 [phone]

## 2024-08-14 ENCOUNTER — Emergency Department: Payer: MEDICAID

## 2024-08-14 ENCOUNTER — Emergency Department
Admission: EM | Admit: 2024-08-14 | Discharge: 2024-08-14 | Disposition: A | Discharge status: Home / Self Care | Payer: MEDICAID | Attending: Emergency Medicine | Admitting: Emergency Medicine

## 2024-08-14 ENCOUNTER — Encounter: Payer: Self-pay | Admitting: Emergency Medicine

## 2024-08-14 ENCOUNTER — Other Ambulatory Visit: Payer: Self-pay

## 2024-08-14 DIAGNOSIS — R0789 Other chest pain: Secondary | ICD-10-CM | POA: Insufficient documentation

## 2024-08-14 DIAGNOSIS — R1012 Left upper quadrant pain: Secondary | ICD-10-CM | POA: Diagnosis not present

## 2024-08-14 DIAGNOSIS — C7B8 Other secondary neuroendocrine tumors: Secondary | ICD-10-CM | POA: Diagnosis not present

## 2024-08-14 DIAGNOSIS — C7A8 Other malignant neuroendocrine tumors: Secondary | ICD-10-CM | POA: Insufficient documentation

## 2024-08-14 HISTORY — DX: Malignant (primary) neoplasm, unspecified: C80.1

## 2024-08-14 HISTORY — DX: Essential (primary) hypertension: I10

## 2024-08-14 LAB — CBC
HCT: 39.5 % (ref 39.0–52.0)
Hemoglobin: 12 g/dL — ABNORMAL LOW (ref 13.0–17.0)
MCH: 26.3 pg (ref 26.0–34.0)
MCHC: 30.4 g/dL (ref 30.0–36.0)
MCV: 86.6 fL (ref 80.0–100.0)
Platelets: 267 K/uL (ref 150–400)
RBC: 4.56 MIL/uL (ref 4.22–5.81)
RDW: 17 % — ABNORMAL HIGH (ref 11.5–15.5)
WBC: 9.1 K/uL (ref 4.0–10.5)
nRBC: 0 % (ref 0.0–0.2)

## 2024-08-14 LAB — D-DIMER, QUANTITATIVE: D-Dimer, Quant: 1.29 ug{FEU}/mL — ABNORMAL HIGH (ref 0.00–0.50)

## 2024-08-14 LAB — BASIC METABOLIC PANEL WITH GFR
Anion gap: 11 (ref 5–15)
BUN: 11 mg/dL (ref 6–20)
CO2: 32 mmol/L (ref 22–32)
Calcium: 9.4 mg/dL (ref 8.9–10.3)
Chloride: 98 mmol/L (ref 98–111)
Creatinine, Ser: 1.08 mg/dL (ref 0.61–1.24)
GFR, Estimated: >60 mL/min (ref >60)
Glucose, Bld: 100 mg/dL — ABNORMAL HIGH (ref 70–99)
Potassium: 3.1 mmol/L — ABNORMAL LOW (ref 3.5–5.1)
Sodium: 141 mmol/L (ref 135–145)

## 2024-08-14 LAB — TROPONIN T, HIGH SENSITIVITY
Troponin T High Sensitivity: <15 ng/L (ref 0–19)
Troponin T High Sensitivity: <15 ng/L (ref 0–19)

## 2024-08-14 MED ORDER — OXYCODONE-ACETAMINOPHEN 5-325 MG PO TABS
1.0000 | ORAL_TABLET | Freq: Once | ORAL | Status: AC
  Administered 2024-08-14: 1 via ORAL
  Filled 2024-08-14: qty 1

## 2024-08-14 MED ORDER — KETOROLAC TROMETHAMINE 15 MG/ML IJ SOLN
15.0000 mg | Freq: Once | INTRAMUSCULAR | Status: AC
  Administered 2024-08-14: 15 mg via INTRAVENOUS
  Filled 2024-08-14: qty 1

## 2024-08-14 MED ORDER — IOHEXOL 350 MG/ML SOLN
75.0000 mL | Freq: Once | INTRAVENOUS | Status: AC | PRN
  Administered 2024-08-14: 75 mL via INTRAVENOUS

## 2024-08-14 MED ORDER — LORAZEPAM 2 MG/ML IJ SOLN
1.0000 mg | Freq: Once | INTRAMUSCULAR | Status: AC
  Administered 2024-08-14: 1 mg via INTRAVENOUS
  Filled 2024-08-14: qty 1

## 2024-08-14 MED ORDER — ONDANSETRON HCL 4 MG/2ML IJ SOLN
4.0000 mg | Freq: Once | INTRAMUSCULAR | Status: AC
  Administered 2024-08-14: 4 mg via INTRAVENOUS
  Filled 2024-08-14: qty 2

## 2024-08-14 MED ORDER — OXYCODONE-ACETAMINOPHEN 5-325 MG PO TABS
1.0000 | ORAL_TABLET | ORAL | 0 refills | Status: AC | PRN
Start: 2024-08-14 — End: 2024-08-21

## 2024-08-14 MED ORDER — OXYCODONE-ACETAMINOPHEN 5-325 MG PO TABS
2.0000 | ORAL_TABLET | Freq: Once | ORAL | Status: DC
Start: 2024-08-14 — End: 2024-08-14

## 2024-08-14 NOTE — Discharge Instructions (Addendum)
 Take the pain medication as needed.  Make appointment to follow-up with your oncologist at [facility] as soon as possible.  Return here or to the nearest ER immediately for new, worsening, or persistent severe chest pain, difficulty breathing, fever, weakness, vomiting or inability hold anything down, abdominal swelling, or any other new or worsening symptoms that concern you.

## 2024-08-14 NOTE — ED Provider Notes (Addendum)
 [facility] Provider Note    Event Date/Time   First MD Initiated Contact with Patient 08/14/24 [phone]     (approximate)   History   Chest Pain   HPI  Carlos Duran is a 53 y.o. male with a history of a neuroendocrine tumor and opiate abuse who presents with chest pain, acute onset last night, pressure-like, moving around to his chest, but not radiating to his back or arms.  He denies associated shortness of breath.  He states he does feel little bit lightheaded standing up.  The patient denies any leg swelling.  He also reports that he has knots in his belly and that are painful which he believes are related to his tumor diagnosis.  They have been present for months and have gradually worsened.  He denies any vomiting but does have loose stools which are somewhat chronic.  I reviewed the past medical records.  The patient's most recent prior encounter in our system was on 5/21 in the [facility] ED for eye redness.  He was seen by hematology at [facility] for follow-up and staging of the neuroendocrine tumor most recently in March of this year.  He was offered treatment with Sandostatin at that time, but the patient states he never started the treatment regimen.  He had an exploratory laparotomy last year.   Physical Exam   Triage Vital Signs: ED Triage Vitals  Encounter Vitals Group     BP 08/14/24 0937 (!) 138/96     Girls Systolic BP Percentile --      Girls Diastolic BP Percentile --      Boys Systolic BP Percentile --      Boys Diastolic BP Percentile --      Pulse Rate 08/14/24 0937 (!) 103     Resp 08/14/24 0937 16     Temp 08/14/24 0937 98.5 F (36.9 C)     Temp Source 08/14/24 0937 Oral     SpO2 08/14/24 0937 100 %     Weight 08/14/24 0935 230 lb (104.3 kg)     Height --      Head Circumference --      Peak Flow --      Pain Score 08/14/24 0934 8     Pain Loc --      Pain Education --      Exclude from Growth Chart --     Most  recent vital signs: Vitals:   08/14/24 1430 08/14/24 1444  BP: (!) 157/101   Pulse: 92   Resp: 13   Temp:  99.6 F (37.6 C)  SpO2: 100%      General: Awake, no distress.  CV:  Good peripheral perfusion.  Resp:  Normal effort.  Lungs CTAB. Abd:  No distention.  Palpable mass to the left upper abdomen with mild tenderness. Other:  Peripheral edema.   ED Results / Procedures / Treatments   Labs (all labs ordered are listed, but only abnormal results are displayed) Labs Reviewed  BASIC METABOLIC PANEL WITH GFR - Abnormal; Notable for the following components:      Result Value   Potassium 3.1 (*)    Glucose, Bld 100 (*)    All other components within normal limits  CBC - Abnormal; Notable for the following components:   Hemoglobin 12.0 (*)    RDW 17.0 (*)    All other components within normal limits  D-DIMER, QUANTITATIVE - Abnormal; Notable for the following components:  D-Dimer, Quant 1.29 (*)    All other components within normal limits  TROPONIN T, HIGH SENSITIVITY  TROPONIN T, HIGH SENSITIVITY     EKG  ED ECG REPORT I, Waylon Cassis, the attending physician, personally viewed and interpreted this ECG.  Date: 08/14/2024 EKG Time: 0934 Rate: 99 Rhythm: normal sinus rhythm QRS Axis: normal Intervals: normal ST/T Wave abnormalities: normal Narrative Interpretation: no evidence of acute ischemia    RADIOLOGY  Chest x-ray: I independently viewed and interpreted the images; there are faint bibasilar opacities but no focal consolidation or edema  CT angio chest:   IMPRESSION:  1. Negative for pulmonary embolus.  2. Partially calcified small bowel mesenteric mass with progressive  pulmonary, intrathoracic nodal, hepatic and subcutaneous metastatic  disease, in this patient with a history of neuroendocrine tumor.  3. Right adrenal adenoma.  4. Small left renal stone.  5.  Emphysema (ICD10-J43.9).    PROCEDURES:  Critical Care performed:  No  Procedures   MEDICATIONS ORDERED IN ED: Medications  oxyCODONE -acetaminophen  (PERCOCET/ROXICET) 5-325 MG per tablet 1 tablet (has no administration in time range)  ketorolac  (TORADOL ) 15 MG/ML injection 15 mg (15 mg Intravenous Given 08/14/24 1142)  ondansetron  (ZOFRAN ) injection 4 mg (4 mg Intravenous Given 08/14/24 1152)  iohexol  (OMNIPAQUE ) 350 MG/ML injection 75 mL (75 mLs Intravenous Contrast Given 08/14/24 1335)  LORazepam  (ATIVAN ) injection 1 mg (1 mg Intravenous Given 08/14/24 1304)     IMPRESSION / MDM / ASSESSMENT AND PLAN / ED COURSE  I reviewed the triage vital signs and the nursing notes.  53 year old male with PMH as noted above presents with chest pain since last night.  He also has some palpable abdominal masses which are more subacute.  On exam he is borderline tachycardic with otherwise normal vital signs.  Lungs are clear to auscultation.  EKG is nonischemic.  Differential diagnosis includes, but is not limited to, musculoskeletal pain, GERD, ACS, other cardiac etiology, pneumonia or other acute infection, metastatic disease related to his neuroendocrine tumor, PE.  We will obtain a chest x-ray, lab workup, and reassess.  The abdominal masses are chronic.  The patient has no vomiting or symptoms of obstruction.  There is no indication for abdominal imaging.  Patient's presentation is most consistent with acute presentation with potential threat to life or bodily function.  The patient is on the cardiac monitor to evaluate for evidence of arrhythmia and/or significant heart rate changes.   ----------------------------------------- 3:42 PM on 08/14/2024 -----------------------------------------  Troponins are negative x 2.  D-dimer was elevated.  CBC shows no leukocytosis or other acute findings.  BMP is unremarkable.  Chest x-ray showed possible bibasilar opacities although overall presentation is not convincing for pneumonia.  Because of this and the elevated  D-dimer I proceeded with a CTA of the chest.  It does not show any evidence of pneumonia.  There is no PE.  It does show multiple metastases of the patient's neuroendocrine tumor which is likely explanation of both his chest pain as well as the abdominal wall nodules he is feeling.  At this time, given otherwise negative lab workup and stable vital signs, the patient is stable for discharge.  He is eager to follow-up with his oncologist at [facility].  I did offer him referral here, but he would prefer to follow-up there.  The patient reports relatively severe pain over the last several days, and is requesting something stronger for pain.  I reviewed the PDMP as well as his prior history of remote IVDU  and more recent fentanyl use.  He used to be on Suboxone but is not any longer.  Given that he has a history of opiate use disorder, he has an elevated risk of recurrent opiate dependence.  However given that he is having pain due to metastatic cancer, I think it is reasonable to prescribe opiate analgesia for acute pain.  I had an extensive discussion with the patient about risks, benefits and alternatives.  He understands these risks and and can paraphrase them back to me.  He demonstrates appropriate decision-making capacity.  Therefore, I have prescribed a small quantity of Percocet for term management of breakthrough pain until he is able to follow-up.  I gave strict return precautions, and he expresses understanding.   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    Jacolyn Pae, MD 08/14/24  1546    Jacolyn Pae, MD 08/14/24 1550

## 2024-08-14 NOTE — ED Triage Notes (Signed)
 C/O mid chest pain since last night.  Also C/O chills and body aches all week.  Hx Cancer and I got knots growing in me  Patient states the knots have been growing for a while, but has not told doctor about it.   AAOx3. Skin warm and dry. NAD. No SOB/ DOE

## 2024-08-14 NOTE — ED Notes (Signed)
 Called CCMD to add pt to monitoring.

## 2024-08-14 NOTE — ED Notes (Signed)
 Pt verbalized having a ride.
